# Patient Record
Sex: Male | Born: 1960 | Hispanic: No | Marital: Single | Smoking: Never smoker
Health system: Southern US, Community
[De-identification: ages and names within clinical notes are randomized; demographics above are authoritative.]

## PROBLEM LIST (undated history)

## (undated) DIAGNOSIS — K219 Gastro-esophageal reflux disease without esophagitis: Secondary | ICD-10-CM

## (undated) DIAGNOSIS — E079 Disorder of thyroid, unspecified: Secondary | ICD-10-CM

## (undated) DIAGNOSIS — R739 Hyperglycemia, unspecified: Secondary | ICD-10-CM

## (undated) DIAGNOSIS — F209 Schizophrenia, unspecified: Secondary | ICD-10-CM

## (undated) DIAGNOSIS — E669 Obesity, unspecified: Secondary | ICD-10-CM

## (undated) DIAGNOSIS — E113499 Type 2 diabetes mellitus with severe nonproliferative diabetic retinopathy without macular edema, unspecified eye: Secondary | ICD-10-CM

## (undated) DIAGNOSIS — K580 Irritable bowel syndrome with diarrhea: Secondary | ICD-10-CM

## (undated) DIAGNOSIS — E785 Hyperlipidemia, unspecified: Secondary | ICD-10-CM

## (undated) DIAGNOSIS — R7301 Impaired fasting glucose: Secondary | ICD-10-CM

## (undated) DIAGNOSIS — F419 Anxiety disorder, unspecified: Secondary | ICD-10-CM

## (undated) DIAGNOSIS — F32A Depression, unspecified: Secondary | ICD-10-CM

## (undated) DIAGNOSIS — F71 Moderate intellectual disabilities: Secondary | ICD-10-CM

## (undated) HISTORY — DX: Impaired fasting glucose: R73.01

## (undated) HISTORY — DX: Irritable bowel syndrome with diarrhea: K58.0

## (undated) HISTORY — DX: Moderate intellectual disabilities: F71

## (undated) HISTORY — PX: MOUTH SURGERY: SHX715

## (undated) HISTORY — DX: Type 2 diabetes mellitus with severe nonproliferative diabetic retinopathy without macular edema, unspecified eye: E11.3499

## (undated) HISTORY — DX: Obesity, unspecified: E66.9

## (undated) HISTORY — PX: COLONOSCOPY: SHX174

## (undated) HISTORY — DX: Schizophrenia, unspecified: F20.9

## (undated) HISTORY — DX: Hyperglycemia, unspecified: R73.9

## (undated) HISTORY — DX: Disorder of thyroid, unspecified: E07.9

## (undated) HISTORY — DX: Depression, unspecified: F32.A

## (undated) HISTORY — DX: Hyperlipidemia, unspecified: E78.5

## (undated) HISTORY — DX: Anxiety disorder, unspecified: F41.9

## (undated) HISTORY — DX: Gastro-esophageal reflux disease without esophagitis: K21.9

---

## 1998-04-16 ENCOUNTER — Other Ambulatory Visit: Admission: RE | Admit: 1998-04-16 | Discharge: 1998-04-16 | Payer: Self-pay | Admitting: Family Medicine

## 2014-03-19 ENCOUNTER — Telehealth: Payer: Self-pay | Admitting: Internal Medicine

## 2014-03-19 NOTE — Telephone Encounter (Signed)
S/W PATIENT AND GAVE NEW PATIENT APPT FOR 04/10 @ 1:30 W/DR. CHISM REFERRING DR. Stuart PACKET MAILED.

## 2014-03-20 ENCOUNTER — Telehealth: Payer: Self-pay | Admitting: Internal Medicine

## 2014-03-20 NOTE — Telephone Encounter (Signed)
C/D 03/20/14 for appt. 04/03/14

## 2014-03-23 NOTE — Telephone Encounter (Signed)
S/W PATIENT AND GAVE NEW PATIENT APPT FOR 04/10 @ 1:30 W/DR. CHISM

## 2014-04-03 ENCOUNTER — Other Ambulatory Visit: Payer: Self-pay | Admitting: Internal Medicine

## 2014-04-03 ENCOUNTER — Other Ambulatory Visit (HOSPITAL_BASED_OUTPATIENT_CLINIC_OR_DEPARTMENT_OTHER): Payer: Medicare Other

## 2014-04-03 ENCOUNTER — Telehealth: Payer: Self-pay | Admitting: Internal Medicine

## 2014-04-03 ENCOUNTER — Encounter: Payer: Self-pay | Admitting: Internal Medicine

## 2014-04-03 ENCOUNTER — Ambulatory Visit (HOSPITAL_BASED_OUTPATIENT_CLINIC_OR_DEPARTMENT_OTHER): Payer: Medicare Other | Admitting: Internal Medicine

## 2014-04-03 ENCOUNTER — Ambulatory Visit: Payer: Medicare Other

## 2014-04-03 VITALS — BP 100/60 | HR 72 | Temp 97.7°F | Resp 19 | Wt 126.5 lb

## 2014-04-03 DIAGNOSIS — R634 Abnormal weight loss: Secondary | ICD-10-CM

## 2014-04-03 DIAGNOSIS — D649 Anemia, unspecified: Secondary | ICD-10-CM

## 2014-04-03 DIAGNOSIS — D509 Iron deficiency anemia, unspecified: Secondary | ICD-10-CM

## 2014-04-03 DIAGNOSIS — F79 Unspecified intellectual disabilities: Secondary | ICD-10-CM

## 2014-04-03 DIAGNOSIS — E0590 Thyrotoxicosis, unspecified without thyrotoxic crisis or storm: Secondary | ICD-10-CM

## 2014-04-03 DIAGNOSIS — E785 Hyperlipidemia, unspecified: Secondary | ICD-10-CM

## 2014-04-03 LAB — CBC WITH DIFFERENTIAL/PLATELET
BASO%: 0.3 % (ref 0.0–2.0)
Basophils Absolute: 0 10*3/uL (ref 0.0–0.1)
EOS ABS: 0.1 10*3/uL (ref 0.0–0.5)
EOS%: 0.7 % (ref 0.0–7.0)
HCT: 33 % — ABNORMAL LOW (ref 38.4–49.9)
HGB: 11 g/dL — ABNORMAL LOW (ref 13.0–17.1)
LYMPH#: 1.1 10*3/uL (ref 0.9–3.3)
LYMPH%: 13.8 % — ABNORMAL LOW (ref 14.0–49.0)
MCH: 28.7 pg (ref 27.2–33.4)
MCHC: 33.4 g/dL (ref 32.0–36.0)
MCV: 85.8 fL (ref 79.3–98.0)
MONO#: 1 10*3/uL — ABNORMAL HIGH (ref 0.1–0.9)
MONO%: 11.6 % (ref 0.0–14.0)
NEUT%: 73.6 % (ref 39.0–75.0)
NEUTROS ABS: 6.1 10*3/uL (ref 1.5–6.5)
Platelets: 433 10*3/uL — ABNORMAL HIGH (ref 140–400)
RBC: 3.84 10*6/uL — AB (ref 4.20–5.82)
RDW: 13.5 % (ref 11.0–14.6)
WBC: 8.2 10*3/uL (ref 4.0–10.3)

## 2014-04-03 LAB — COMPREHENSIVE METABOLIC PANEL (CC13)
ALK PHOS: 66 U/L (ref 40–150)
ALT: 9 U/L (ref 0–55)
AST: 13 U/L (ref 5–34)
Albumin: 3 g/dL — ABNORMAL LOW (ref 3.5–5.0)
Anion Gap: 7 mEq/L (ref 3–11)
BUN: 14 mg/dL (ref 7.0–26.0)
CHLORIDE: 106 meq/L (ref 98–109)
CO2: 26 mEq/L (ref 22–29)
Calcium: 9.4 mg/dL (ref 8.4–10.4)
Creatinine: 1 mg/dL (ref 0.7–1.3)
GLUCOSE: 113 mg/dL (ref 70–140)
POTASSIUM: 4 meq/L (ref 3.5–5.1)
SODIUM: 140 meq/L (ref 136–145)
TOTAL PROTEIN: 7.9 g/dL (ref 6.4–8.3)
Total Bilirubin: <0.2 mg/dL (ref 0.20–1.20)

## 2014-04-03 LAB — LACTATE DEHYDROGENASE (CC13): LDH: 154 U/L (ref 125–245)

## 2014-04-03 LAB — IRON AND TIBC CHCC
%SAT: 19 % — AB (ref 20–55)
Iron: 34 ug/dL — ABNORMAL LOW (ref 42–163)
TIBC: 184 ug/dL — ABNORMAL LOW (ref 202–409)
UIBC: 150 ug/dL (ref 117–376)

## 2014-04-03 LAB — FERRITIN CHCC: FERRITIN: 452 ng/mL — AB (ref 22–316)

## 2014-04-03 MED ORDER — FERROUS SULFATE 325 (65 FE) MG PO TBEC: 325.0000 mg | DELAYED_RELEASE_TABLET | Freq: Every day | ORAL | Status: DC

## 2014-04-03 NOTE — Progress Notes (Signed)
Pt here today with his caregivers. He lives in a home called "[facility]".

## 2014-04-03 NOTE — Patient Instructions (Signed)
Iron Deficiency Anemia, Adult Anemia is a condition in which there are less red blood cells or hemoglobin in the blood than normal. Hemoglobin is this part of red blood cells that carries oxygen. Iron deficiency anemia is anemia caused by too little iron. It is the most common type of anemia. It may leave you tired and short of breath. CAUSES   Lack of iron in the diet.  Poor absorption of iron, as seen with intestinal disorders.  Intestinal bleeding.  Heavy periods. SIGNS AND SYMPTOMS  Mild anemia may not be noticeable. Symptoms may include:  Fatigue.  Headache.  Pale skin.  Weakness.  Tiredness.  Shortness of breath.  Dizziness.  Cold hands and feet.  Fast or irregular heartbeat. DIAGNOSIS  Diagnosis requires a thorough evaluation and physical exam by your health care provider. Blood tests are generally used to confirm iron deficiency anemia. Additional tests may be done to find the underlying cause of your anemia. These may include:  Testing for blood in the stool (fecal occult blood test).  A procedure to see inside the colon and rectum (colonoscopy).  A procedure to see inside the esophagus and stomach (endoscopy). TREATMENT  Iron deficiency anemia is treated by correcting the cause of the deficiency. Treatment may involve:  Adding iron-rich foods to your diet.  Taking iron supplements. Pregnant or breastfeeding women need to take extra iron, because their normal diet usually does not provide the required amount.  Taking vitamins. Vitamin C improves the absorption of iron. Your health care provider may recommend taking your iron tablets with a glass of orange juice or vitamin C supplement.  Medicines to make heavy menstrual flow lighter.  Surgery. HOME CARE INSTRUCTIONS   Take iron as directed by your health care provider.  If you cannot tolerate taking iron supplements by mouth, talk to your health care provider about taking them through a vein  (intravenously) or an injection into a muscle.  For the best iron absorption, iron supplements should be taken on an empty stomach. If you cannot tolerate them on an empty stomach, you may need to take them with food.  Do not drink milk or take antacids at the same time as your iron supplements. Milk and antacids may interfere with the absorption of iron.  Iron supplements can cause constipation. Make sure to include fiber in your diet to prevent constipation. A stool softener may also be recommended.  Take vitamins as directed by your health care provider.  Eat a diet rich in iron. Foods high in iron include liver, lean beef, whole-grain bread, eggs, dried fruit, and dark green, leafy vegetables. SEEK IMMEDIATE MEDICAL CARE IF:   You faint. If this happens, do not drive. Call your local emergency services (911 in [place] if no other help is available.  You have chest pain.  You feel nauseous or vomit.  You have severe or increased shortness of breath with activity.  You feel weak.  You have a rapid heartbeat.  You have unexplained sweating.  You become lightheaded when getting up from a chair or bed. MAKE SURE YOU:   Understand these instructions.  Will watch your condition.  Will get help right away if you are not doing well or get worse. Document Released: 12/08/2000 Document Revised: 10/01/2013 Document Reviewed: 08/18/2013 [facility] Patient Information 2014 [place]. Hyperthyroidism The thyroid is a large gland located in the lower front part of your neck. The thyroid helps control metabolism. Metabolism is how your body uses food. It controls  metabolism with the hormone thyroxine. When the thyroid is overactive, it produces too much hormone. When this happens, these following problems may occur:   Nervousness  Heat intolerance  Weight loss (in spite of increase food intake)  Diarrhea  Change in hair or skin texture  Palpitations (heart skipping or having  extra beats)  Tachycardia (rapid heart rate)  Loss of menstruation (amenorrhea)  Shaking of the hands CAUSES  Grave's Disease (the immune system attacks the thyroid gland). This is the most common cause.  Inflammation of the thyroid gland.  Tumor (usually benign) in the thyroid gland or elsewhere.  Excessive use of thyroid medications (both prescription and 'natural').  Excessive ingestion of Iodine. DIAGNOSIS  To prove hyperthyroidism, your caregiver may do blood tests and ultrasound tests. Sometimes the signs are hidden. It may be necessary for your caregiver to watch this illness with blood tests, either before or after diagnosis and treatment. TREATMENT Short-term treatment There are several treatments to control symptoms. Drugs called beta blockers may give some relief. Drugs that decrease hormone production will provide temporary relief in many people. These measures will usually not give permanent relief. Definitive therapy There are treatments available which can be discussed between you and your caregiver which will permanently treat the problem. These treatments range from surgery (removal of the thyroid), to the use of radioactive iodine (destroys the thyroid by radiation), to the use of antithyroid drugs (interfere with hormone synthesis). The first two treatments are permanent and usually successful. They most often require hormone replacement therapy for life. This is because it is impossible to remove or destroy the exact amount of thyroid required to make a person euthyroid (normal). HOME CARE INSTRUCTIONS  See your caregiver if the problems you are being treated for get worse. Examples of this would be the problems listed above. SEEK MEDICAL CARE IF: Your general condition worsens. MAKE SURE YOU:   Understand these instructions.  Will watch your condition.  Will get help right away if you are not doing well or get worse. Document Released: 12/11/2005 Document  Revised: 03/04/2012 Document Reviewed: 04/24/2007 [facility] Patient Information 2014 [place], [place].

## 2014-04-03 NOTE — Progress Notes (Signed)
[place] [phone]   Fax:([phone]  NEW PATIENT EVALUATION   Name: Micheal Vazquez Date: 04/06/2014 MRN: [id number] DOB: 02-15-1961  PCP: Micheal Downing, MD   REFERRING PHYSICIAN: Leonard Vazquez, *  REASON FOR REFERRAL: Anemia NOS and Weight loss    HISTORY OF PRESENT ILLNESS:Micheal Vazquez is a 53 y.o. male who is being referred to our office for further evaluation of anemia NOS and weight loss.   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  They report that he was diagnosed with hyperthyroidism over the past six months and has been compliant to medications for this problem.  He has a weight loss of nearly 15 lbs over the past few months.  He was 180 lbs when he first entered the facility.  He has excellent appetite.  He walks for exercises around the group home, at the park and at the [facility].  Per records accompanied the patient today, he was on methimazole 5 mg bid for what appears to be hyperthyroidism.  TSH was obtained on 03-06-2014 and was within normal limits at 3.160.  His anemia dates back to 11/2013.  His last labs obtained on 03-12-2014 revealed a Hbg of 12.3 and a retic of 0.4.  He has had a screening colonoscopy 12/2012 and it was without evidence of malignancy per the staff (chart notes path with colon tubular adenoma).  Doing this time, he was also H. Pylori positive which was treated.  Patient denies any symptoms of anemia or picca. Labs on 03/18/2014 demonstrate WBC of 9.7; H/H of 12.9/37/7; MCV of 87; Plt count of 175. His labs of 12/29/2013 revealed a free T4 of 0.78 (low) and Ferritin of 353. On 12/24/2013, his TSH was low at 0.327.   PAST MEDICAL HISTORY:  has no past medical history on file.   PAST SURGICAL HISTORY:No past surgical history on file.   CURRENT MEDICATIONS:  has a current medication list which includes the following prescription(s): carbamazepine, epinephrine, escitalopram, lorazepam, pravastatin, risperidone, and ferrous sulfate.   ALLERGIES: Other and Valium   SOCIAL HISTORY:  reports that he has never smoked. He does not have any smokeless tobacco history on file.   FAMILY HISTORY: family history is not on file.  Patient and staff was not sure of the patient's family history.    LABORATORY DATA:  CBC    Component Value Date/Time   WBC 8.2 04/03/2014 1358   RBC 3.84* 04/03/2014 1358   HGB 11.0* 04/03/2014 1358   HCT 33.0* 04/03/2014 1358   PLT 433* 04/03/2014 1358   MCV 85.8 04/03/2014 1358   MCH 28.7 04/03/2014 1358   MCHC 33.4 04/03/2014 1358   RDW 13.5 04/03/2014 1358   LYMPHSABS 1.1 04/03/2014 1358   MONOABS 1.0* 04/03/2014 1358   EOSABS 0.1 04/03/2014 1358   BASOSABS 0.0 04/03/2014 1358   CMP     Component Value Date/Time   NA 140 04/03/2014 1358   K 4.0 04/03/2014 1358   CO2 26 04/03/2014 1358   GLUCOSE 113 04/03/2014 1358   BUN 14.0 04/03/2014 1358   CREATININE 1.0 04/03/2014 1358   CALCIUM 9.4 04/03/2014 1358   PROT 7.9 04/03/2014 1358   ALBUMIN 3.0* 04/03/2014 1358   AST 13 04/03/2014 1358   ALT 9 04/03/2014 1358   ALKPHOS 66 04/03/2014  1358   BILITOT <0.20 04/03/2014 1358    Results for Micheal Vazquez, Micheal Vazquez (MRN [id number]) as of 04/06/2014 04:02  Ref. Range 04/03/2014 13:58  Iron Latest Range: 42-163 ug/dL 34 (L)  UIBC Latest Range: 117-376 ug/dL 150  TIBC Latest Range: 202-409 ug/dL 184 (L)  %SAT Latest Range: 20-55 % 19 (L)  Ferritin Latest Range: 22-316 ng/ml 452 (H)     RADIOGRAPHY: No results found.     REVIEW OF SYSTEMS:  Constitutional: Denies fevers, chills or abnormal weight loss Eyes: Denies blurriness of vision Ears, nose, mouth, throat, and face: Denies mucositis or sore throat Respiratory: Denies cough, dyspnea or wheezes Cardiovascular: Denies palpitation, chest discomfort or lower extremity  swelling Gastrointestinal:  Denies nausea, heartburn or change in bowel habits Skin: Denies abnormal skin rashes Lymphatics: Denies new lymphadenopathy or easy bruising Neurological:Denies numbness, tingling or new weaknesses Behavioral/Psych: Mood is stable, no new changes  All other systems were reviewed with the patient and are negative.  PHYSICAL EXAM:  weight is 126 lb 8 oz (57.38 kg). His oral temperature is 97.7 F (36.5 C). His blood pressure is 100/60 and his pulse is 72. His respiration is 19.    GENERAL:alert, no distress and comfortable; thin, appears stated age.  SKIN: skin color, texture, turgor are normal, no rashes or significant lesions EYES: normal, Conjunctiva are pink and non-injected, sclera clear OROPHARYNX:no exudate, no erythema and lips, buccal mucosa, and tongue normal; poor dentition.  NECK: supple, thyroid normal size, non-tender, without nodularity LYMPH:  no palpable lymphadenopathy in the cervical, axillary or inguinal LUNGS: clear to auscultation and percussion with normal breathing effort HEART: regular rate & rhythm and no murmurs and no lower extremity edema ABDOMEN:abdomen soft, non-tender and normal bowel sounds Musculoskeletal:no cyanosis of digits and no clubbing  NEURO: alert & oriented x 3 with fluent speech, no focal motor/sensory deficits   IMPRESSION: Micheal Vazquez is a 53 y.o. male with a history of    PLAN:  1.  Anemia NOS. --Patient has normocytic anemia with an elevation in his ferritin, low retic from prior labs, and an MCV of 85.8.  He may have anemia of chronic disease secondary to #2 or inflammation.  His kidney function is within normal limits.  He is up-to-date for his screening colonoscopy.  We suggested continuing to treat illnesses and if persistently less than 11 and/or symptoms of anemia persists, he may require a bone marrow biopsy to assess iron stores.  We will check his peripheral blood smear.   2. Hyperthyroidism. --This  might in part explain some of the weight loss.  He is on methimazole with normalization of his TSH.   Other considerations for weight lost would be malignancy.  He denies a smoking history and he has had a screening colonoscopy.  He might require a DRE plus PSA.  He denies bone pain.  His knowledge of his family history is limited.   3. Mental disabled. --He lives at a group home.  They will continue to monitor the above.   4. Follow-up. --He will follow up with me in 2 months for a symptom check visit and cbc and chemistries.   All questions were answered. The patient knows to call the clinic with any problems, questions or concerns. We can certainly see the patient much sooner if necessary.  I spent 40 minutes counseling the patient face to face. The total time spent in the appointment was 60 minutes.    Concha Norway, MD 04/06/2014 4:01 AM

## 2014-04-03 NOTE — Progress Notes (Signed)
Checked in new pt with no financial concerns. °

## 2014-04-03 NOTE — Telephone Encounter (Signed)
Gave pt appt for lab and Md for June 2015 °

## 2014-04-06 DIAGNOSIS — E785 Hyperlipidemia, unspecified: Secondary | ICD-10-CM | POA: Insufficient documentation

## 2014-06-03 ENCOUNTER — Telehealth: Payer: Self-pay | Admitting: Internal Medicine

## 2014-06-03 ENCOUNTER — Other Ambulatory Visit (HOSPITAL_BASED_OUTPATIENT_CLINIC_OR_DEPARTMENT_OTHER): Payer: Medicare Other

## 2014-06-03 ENCOUNTER — Other Ambulatory Visit: Payer: Self-pay | Admitting: *Deleted

## 2014-06-03 ENCOUNTER — Encounter: Payer: Self-pay | Admitting: Internal Medicine

## 2014-06-03 ENCOUNTER — Other Ambulatory Visit: Payer: Self-pay | Admitting: Internal Medicine

## 2014-06-03 ENCOUNTER — Ambulatory Visit (HOSPITAL_BASED_OUTPATIENT_CLINIC_OR_DEPARTMENT_OTHER): Payer: Medicare Other | Admitting: Internal Medicine

## 2014-06-03 VITALS — BP 97/58 | HR 63 | Temp 98.4°F | Resp 19 | Wt 125.2 lb

## 2014-06-03 DIAGNOSIS — D72819 Decreased white blood cell count, unspecified: Secondary | ICD-10-CM

## 2014-06-03 DIAGNOSIS — Z125 Encounter for screening for malignant neoplasm of prostate: Secondary | ICD-10-CM

## 2014-06-03 DIAGNOSIS — D709 Neutropenia, unspecified: Secondary | ICD-10-CM

## 2014-06-03 DIAGNOSIS — D509 Iron deficiency anemia, unspecified: Secondary | ICD-10-CM

## 2014-06-03 DIAGNOSIS — E039 Hypothyroidism, unspecified: Secondary | ICD-10-CM

## 2014-06-03 DIAGNOSIS — D649 Anemia, unspecified: Secondary | ICD-10-CM

## 2014-06-03 DIAGNOSIS — R634 Abnormal weight loss: Secondary | ICD-10-CM

## 2014-06-03 LAB — CBC & DIFF AND RETIC
BASO%: 0.5 % (ref 0.0–2.0)
Basophils Absolute: 0 10*3/uL (ref 0.0–0.1)
EOS%: 0.5 % (ref 0.0–7.0)
Eosinophils Absolute: 0 10*3/uL (ref 0.0–0.5)
HCT: 35.1 % — ABNORMAL LOW (ref 38.4–49.9)
HGB: 12 g/dL — ABNORMAL LOW (ref 13.0–17.1)
Immature Retic Fract: 1.7 % — ABNORMAL LOW (ref 3.00–10.60)
LYMPH%: 32.3 % (ref 14.0–49.0)
MCH: 28.6 pg (ref 27.2–33.4)
MCHC: 34.2 g/dL (ref 32.0–36.0)
MCV: 83.8 fL (ref 79.3–98.0)
MONO#: 0.2 10*3/uL (ref 0.1–0.9)
MONO%: 11.1 % (ref 0.0–14.0)
NEUT#: 1.2 10*3/uL — ABNORMAL LOW (ref 1.5–6.5)
NEUT%: 55.6 % (ref 39.0–75.0)
Platelets: 170 10*3/uL (ref 140–400)
RBC: 4.19 10*6/uL — AB (ref 4.20–5.82)
RDW: 13.6 % (ref 11.0–14.6)
RETIC %: 0.61 % — AB (ref 0.80–1.80)
Retic Ct Abs: 25.56 10*3/uL — ABNORMAL LOW (ref 34.80–93.90)
WBC: 2.2 10*3/uL — AB (ref 4.0–10.3)
lymph#: 0.7 10*3/uL — ABNORMAL LOW (ref 0.9–3.3)

## 2014-06-03 LAB — IRON AND TIBC CHCC
%SAT: 58 % — AB (ref 20–55)
Iron: 123 ug/dL (ref 42–163)
TIBC: 211 ug/dL (ref 202–409)
UIBC: 88 ug/dL — AB (ref 117–376)

## 2014-06-03 LAB — FERRITIN CHCC: Ferritin: 312 ng/ml (ref 22–316)

## 2014-06-03 LAB — CHCC SMEAR

## 2014-06-03 NOTE — Telephone Encounter (Signed)
Gave pt appt for lab and MD  for August 2015 °

## 2014-06-03 NOTE — Patient Instructions (Signed)
Neutropenia Neutropenia is a condition that occurs when the level of a certain type of white blood cell (neutrophil) in your body becomes lower than normal. Neutrophils are made in the bone marrow and fight infections. These cells protect against bacteria and viruses. The fewer neutrophils you have, and the longer your body remains without them, the greater your risk of getting a severe infection becomes. CAUSES  The cause of neutropenia may be hard to determine. However, it is usually due to 3 main problems:   Decreased production of neutrophils. This may be due to:  Certain medicines such as chemotherapy.  Genetic problems.  Cancer.  Radiation treatments.  Vitamin deficiency.  Some pesticides.  Increased destruction of neutrophils. This may be due to:  Overwhelming infections.  Hemolytic anemia. This is when the body destroys its own blood cells.  Chemotherapy.  Neutrophils moving to areas of the body where they cannot fight infections. This may be due to:  Dialysis procedures.  Conditions where the spleen becomes enlarged. Neutrophils are held in the spleen and are not available to the rest of the body.  Overwhelming infections. The neutrophils are held in the area of the infection and are not available to the rest of the body. SYMPTOMS  There are no specific symptoms of neutropenia. The lack of neutrophils can result in an infection, and an infection can cause various problems. DIAGNOSIS  Diagnosis is made by a blood test. A complete blood count is performed. The normal level of neutrophils in human blood differs with age and race. Infants have lower counts than older children and adults. African Americans have lower counts than Caucasians or Asians. The average adult level is 1500 cells/mm3 of blood. Neutrophil counts are interpreted as follows:  Greater than 1000 cells/mm3 gives normal protection against infection.  500 to 1000 cells/mm3 gives an increased risk for  infection.  200 to 500 cells/mm3 is a greater risk for severe infection.  Lower than 200 cells/mm3 is a marked risk of infection. This may require hospitalization and treatment with antibiotic medicines. TREATMENT  Treatment depends on the underlying cause, severity, and presence of infections or symptoms. It also depends on your health. Your caregiver will discuss the treatment plan with you. Mild cases are often easily treated and have a good outcome. Preventative measures may also be started to limit your risk of infections. Treatment can include:  Taking antibiotics.  Stopping medicines that are known to cause neutropenia.  Correcting nutritional deficiencies by eating green vegetables to supply folic acid and taking vitamin B supplements.  Stopping exposure to pesticides if your neutropenia is related to pesticide exposure.  Taking a blood growth factor called sargramostim, pegfilgrastim, or filgrastim if you are undergoing chemotherapy for cancer. This stimulates white blood cell production.  Removal of the spleen if you have Felty's syndrome and have repeated infections. HOME CARE INSTRUCTIONS   Follow your caregiver's instructions about when you need to have blood work done.  Wash your hands often. Make sure others who come in contact with you also wash their hands.  Wash raw fruits and vegetables before eating them. They can carry bacteria and fungi.  Avoid people with colds or spreadable (contagious) diseases (chickenpox, herpes zoster, influenza).  Avoid large crowds.  Avoid construction areas. The dust can release fungus into the air.  Be cautious around children in daycare or school environments.  Take care of your respiratory system by coughing and deep breathing.  Bathe daily.  Protect your skin from cuts and   burns.  Do not work in the garden or with flowers and plants.  Care for the mouth before and after meals by brushing with a soft toothbrush. If you have  mucositis, do not use mouthwash. Mouthwash contains alcohol and can dry out the mouth even more.  Clean the area between the genitals and the anus (perineal area) after urination and bowel movements. Women need to wipe from front to back.  Use a water soluble lubricant during sexual intercourse and practice good hygiene after. Do not have intercourse if you are severely neutropenic. Check with your caregiver for guidelines.  Exercise daily as tolerated.  Avoid people who were vaccinated with a live vaccine in the past 30 days. You should not receive live vaccines (polio, typhoid).  Do not provide direct care for pets. Avoid animal droppings. Do not clean litter boxes and bird cages.  Do not share food utensils.  Do not use tampons, enemas, or rectal suppositories unless directed by your caregiver.  Use an electric razor to remove hair.  Wash your hands after handling magazines, letters, and newspapers. SEEK IMMEDIATE MEDICAL CARE IF:   You have a fever.  You have chills or start to shake.  You feel nauseous or vomit.  You develop mouth sores.  You develop aches and pains.  You have redness and swelling around open wounds.  Your skin is warm to the touch.  You have pus coming from your wounds.  You develop swollen lymph nodes.  You feel weak or fatigued.  You develop red streaks on the skin. MAKE SURE YOU:  Understand these instructions.  Will watch your condition.  Will get help right away if you are not doing well or get worse. Document Released: 06/02/2002 Document Revised: 03/04/2012 Document Reviewed: 06/30/2011 [facility] Patient Information 2014 [place], [place].   Carbamazepine chewable tablets What is this medicine? CARBAMAZEPINE (kar ba MAZ e peen) is used to control seizures caused by certain types of epilepsy. This medicine is also used to treat nerve related pain. It is not for common aches and pains. This medicine may be used for other purposes; ask  your health care provider or pharmacist if you have questions. COMMON BRAND NAME(S): Tegretol What should I tell my health care provider before I take this medicine? They need to know if you have any of these conditions: -Asian ancestry -bone marrow disease -glaucoma -heart disease or irregular heartbeat -kidney disease -liver disease -low blood counts, like low white cell, platelet, or red cell counts -porphyria -psychotic disorders -suicidal thoughts, plans, or attempt; a previous suicide attempt by you or a family member -an unusual or allergic reaction to carbamazepine, tricyclic antidepressants, phenytoin, phenobarbital or other medicines, foods, dyes, or preservatives -pregnant or trying to get pregnant -breast-feeding How should I use this medicine? Take this medicine by mouth. Chew it or swallow whole. Follow the directions on the prescription label. Take this medicine with food. Take your doses at regular intervals. Do not take your medicine more often than directed. Do not stop taking this medicine except on the advice of your doctor or health care professional. A special MedGuide will be given to you by the pharmacist with each prescription and refill. Be sure to read this information carefully each time. Talk to your pediatrician regarding the use of this medicine in children. While this drug may be prescribed for children 65 years of age and younger for selected conditions, precautions do apply. Overdosage: If you think you have taken too much of this  medicine contact a poison control center or emergency room at once. NOTE: This medicine is only for you. Do not share this medicine with others. What if I miss a dose? If you miss a dose, take it as soon as you can. If it is almost time for your next dose, take only that dose. Do not take double or extra doses. What may interact with this medicine? Do not take this medicine with any of the following  medications: -delavirdine -MAOIs like Carbex, Eldepryl, Marplan, Nardil, and Parnate -nefazodone -oxcarbazepine This medicine may also interact with the following medications: -acetaminophen -acetazolamide -barbiturate medicines for inducing sleep or treating seizures, like phenobarbital -certain antibiotics like clarithromycin, erythromycin or troleandomycin -cimetidine -cyclosporine -danazol -dicumarol -doxycycline -male hormones, including estrogens and birth control pills -grapefruit juice -isoniazid, INH -levothyroxine and other thyroid hormones -lithium and other medicines to treat mood problems or psychotic disturbances -loratadine -medicines for angina or high blood pressure -medicines for cancer -medicines for depression or anxiety -medicines for sleep -medicines to treat fungal infections, like fluconazole, itraconazole or ketoconazole -medicines used to treat HIV infection or AIDS -methadone -niacinamide -praziquantel -propoxyphene -rifampin or rifabutin -seizure or epilepsy medicine -steroid medicines such as prednisone or cortisone -theophylline -tramadol -warfarin This list may not describe all possible interactions. Give your health care provider a list of all the medicines, herbs, non-prescription drugs, or dietary supplements you use. Also tell them if you smoke, drink alcohol, or use illegal drugs. Some items may interact with your medicine. What should I watch for while using this medicine? Visit your doctor or health care professional for a regular check on your progress. Do not change brands or dosage forms of this medicine without discussing the change with your doctor or health care professional. If you are taking this medicine for epilepsy (seizures) do not stop taking it suddenly. This increases the risk of seizures. Wear a Probation officer or necklace. Carry an identification card with information about your condition, medications, and doctor or  health care professional. Dennis Bast may get drowsy, dizzy, or have blurred vision. Do not drive, use machinery, or do anything that needs mental alertness until you know how this medicine affects you. To reduce dizzy or fainting spells, do not sit or stand up quickly, especially if you are an older patient. Alcohol can increase drowsiness and dizziness. Avoid alcoholic drinks. Birth control pills may not work properly while you are taking this medicine. Talk to your doctor about using an extra method of birth control. This medicine can make you more sensitive to the sun. Keep out of the sun. If you cannot avoid being in the sun, wear protective clothing and use sunscreen. Do not use sun lamps or tanning beds/booths. The use of this medicine may increase the chance of suicidal thoughts or actions. Pay special attention to how you are responding while on this medicine. Any worsening of mood, or thoughts of suicide or dying should be reported to your health care professional right away. Women who become pregnant while using this medicine may enroll in the [place] Pregnancy Registry by calling [phone]. This registry collects information about the safety of antiepileptic drug use during pregnancy. What side effects may I notice from receiving this medicine? Side effects that you should report to your doctor or health care professional as soon as possible: -allergic reactions like skin rash, itching or hives, swelling of the face, lips, or tongue -breathing problems -change in vision -confusion -dark urine -fast or irregular heartbeat -  fever or chills, sore throat -mouth ulcers -pain or difficulty passing urine -redness, blistering, peeling or loosening of the skin, including inside the mouth -ringing in the ears -seizures -stomach pain -swollen joints or muscle/joint aches and pains -unusual bleeding or bruising -unusually weak or tired -vomiting -worsening of mood,  thoughts or actions of suicide or dying -yellowing of the eyes or skin Side effects that usually do not require medical attention (report to your doctor or health care professional if they continue or are bothersome): -clumsiness or unsteadiness -diarrhea or constipation -headache -increased sweating -nausea This list may not describe all possible side effects. Call your doctor for medical advice about side effects. You may report side effects to FDA at 1-800-FDA-1088. Where should I keep my medicine? Keep out of reach of children. Store at room temperature below 30 degrees C (86 degrees F). Keep container tightly closed. Protect from moisture. Throw away any unused medicine after the expiration date. NOTE: This sheet is a summary. It may not cover all possible information. If you have questions about this medicine, talk to your doctor, pharmacist, or health care provider.  2014, Elsevier/Gold Standard. (2013-02-18 15:29:29)

## 2014-06-03 NOTE — Progress Notes (Signed)
[facility] OFFICE PROGRESS NOTE  Micheal Downing, MD Micheal Vazquez [id number]  DIAGNOSIS: Neutropenia - Plan: CBC with Differential, Comprehensive metabolic panel (Cmet) - [facility], Lactate dehydrogenase (LDH) - [facility]  Neutropenia, unspecified  Anemia, unspecified  Screening PSA (prostate specific antigen) - Plan: Micheal Vazquez Test  Chief Complaint  Patient presents with  . Weight Loss    CURRENT TREATMENT: Ferrous sulfate bid  INTERVAL HISTORY: Micheal Vazquez 53 y.o. male who was referred to our office for further evaluation of anemia NOS and weight loss and seen initially on 03/25/2014.   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 They report that he was diagnosed with hyperthyroidism over the past six months and has been compliant to medications for this problem. He has a weight loss of nearly 15 lbs over the past few months. He was 180 lbs when he first entered the facility. He has excellent appetite. He walks for exercises around the group home, at the park and at the [facility]. Per records accompanied the patient today, he was on methimazole 5 mg bid for what appears to be hyperthyroidism. TSH was obtained on 03-06-2014 and was within normal limits at 3.160. His anemia dates back to 11/2013. His last labs obtained on 03-12-2014 revealed a Hbg of 12.3 and a retic of 0.4. He has had a screening colonoscopy 12/2012 and it was without evidence of malignancy per the staff (chart notes path with colon tubular adenoma). Doing this time, he was also H. Pylori positive which was treated. Patient denies any symptoms of anemia or picca. Labs on 03/18/2014 demonstrate WBC of 9.7; H/H of 12.9/37/7; MCV of 87; Plt count of 175. His labs of 12/29/2013 revealed a free T4 of 0.78 (low) and Ferritin of 353. On  12/24/2013, his TSH was low at 0.327.   Today, he is accompanied by [employer] conservation officer) at the facility, [facility].   He reports patient's weight has stabilized.   Patient denies any acute medical illnesses.     MEDICAL HISTORY:No past medical history on file.  INTERIM HISTORY: has Hyperlipidemia; Neutropenia, unspecified; Anemia, unspecified; and Screening PSA (prostate specific antigen) on his problem list.    ALLERGIES:  is allergic to other and valium.  MEDICATIONS: has a current medication list which includes the following prescription(s): carbamazepine, epinephrine, escitalopram, ferrous sulfate, lorazepam, pravastatin, and risperidone.  SURGICAL HISTORY: No past surgical history on file.  REVIEW OF SYSTEMS:   Constitutional: Denies fevers, chills or as noted in HPI Eyes: Denies blurriness of vision Ears, nose, mouth, throat, and face: Denies mucositis or sore throat Respiratory: Denies cough, dyspnea or wheezes Cardiovascular: Denies palpitation, chest discomfort or lower extremity swelling Gastrointestinal:  Denies nausea, heartburn or change in bowel habits Skin: Denies abnormal skin rashes Lymphatics: Denies new lymphadenopathy or easy bruising Neurological:Denies numbness, tingling or new weaknesses Behavioral/Psych: Mood is stable, no new changes  All other systems were reviewed with the patient and are negative.  PHYSICAL EXAMINATION: ECOG PERFORMANCE STATUS: 0 - Asymptomatic  Blood pressure 97/58, pulse 63, temperature 98.4 F (36.9 C), temperature source Oral, resp. rate 19, weight 125 lb 3.2 oz (56.79 kg), SpO2 100.00%.  GENERAL:alert, no distress and comfortable SKIN: skin color, texture, turgor are normal, no rashes or significant lesions EYES: normal, Conjunctiva are  pink and non-injected, sclera clear OROPHARYNX:no exudate, no erythema and lips, buccal mucosa, and tongue normal; poor dentition.  NECK: supple, thyroid normal size,  non-tender, without nodularity LYMPH:  no palpable lymphadenopathy in the cervical, axillary or supraclavicular LUNGS: clear to auscultation with normal breathing effort, no wheezes or rhonchi HEART: regular rate & rhythm and no murmurs and no lower extremity edema ABDOMEN:abdomen soft, non-tender and normal bowel sounds Musculoskeletal:no cyanosis of digits and no clubbing  NEURO: alert & oriented x 3 with fluent speech, no focal motor/sensory deficits  Labs:  Lab Results  Component Value Date   WBC 2.2* 06/03/2014   HGB 12.0* 06/03/2014   HCT 35.1* 06/03/2014   MCV 83.8 06/03/2014   PLT 170 06/03/2014   NEUTROABS 1.2* 06/03/2014      Chemistry      Component Value Date/Time   NA 140 04/03/2014 1358   K 4.0 04/03/2014 1358   CO2 26 04/03/2014 1358   BUN 14.0 04/03/2014 1358   CREATININE 1.0 04/03/2014 1358      Component Value Date/Time   CALCIUM 9.4 04/03/2014 1358   ALKPHOS 66 04/03/2014 1358   AST 13 04/03/2014 1358   ALT 9 04/03/2014 1358   BILITOT <0.20 04/03/2014 1358       Basic Metabolic Panel: No results found for this basename: NA, K, CL, CO2, GLUCOSE, BUN, CREATININE, CALCIUM, MG, PHOS,  in the last 168 hours GFR CrCl is unknown because there is no height on file for the current visit. Liver Function Tests: No results found for this basename: AST, ALT, ALKPHOS, BILITOT, PROT, ALBUMIN,  in the last 168 hours No results found for this basename: LIPASE, AMYLASE,  in the last 168 hours No results found for this basename: AMMONIA,  in the last 168 hours Coagulation profile No results found for this basename: INR, PROTIME,  in the last 168 hours  CBC:  Recent Labs Lab 06/03/14 0900  WBC 2.2*  NEUTROABS 1.2*  HGB 12.0*  HCT 35.1*  MCV 83.8  PLT 170    Anemia work up  Recent Labs  06/03/14 0900  FERRITIN 312  TIBC 211  IRON 123    Studies:  No results found.   RADIOGRAPHIC STUDIES: No results found.  ASSESSMENT: Micheal Vazquez 53 y.o. male with a history  of Neutropenia - Plan: CBC with Differential, Comprehensive metabolic panel (Cmet) - [facility], Lactate dehydrogenase (LDH) - [facility]  Neutropenia, unspecified  Anemia, unspecified  Screening PSA (prostate specific antigen) - Plan: Micheal Vazquez Test  PLAN:   1. Anemia NOS.  --Patient has had normocytic anemia with an elevation in his ferritin, low retic from prior labs, and an MCV of 85.8. Today his hemoglobin in 12.   His iron indices are as noted above.  He likely has anemia of chronic disease secondary to #2 or inflammation. His kidney function is within normal limits. He is up-to-date for his screening colonoscopy. We suggested continuing to treat illnesses and if persistently less than 11 and/or symptoms of anemia persists, he may require a bone marrow biopsy to assess iron stores.   2. Leukopenia with mild neutropenia. --He is on tegretol which can cause bone marrow suppression. He will need these levels followed.  We cancelled extensively to report any infections or fevers.  He denies any recurrent infections or fevers presently.   3. Hyperthyroidism and weight loss  --This might in part explain some of the weight loss. He is on methimazole with normalization of his TSH. Other considerations  for weight lost would be malignancy. He denies a smoking history and he has had a screening colonoscopy. He might require a DRE plus PSA. He denies bone pain. His knowledge of his family history is limited.   4. Mental disabled.  --He lives at a group home. They will continue to monitor the above.   5. Follow-up.  --He will follow up with me in 2 months for a symptom check visit and cbc and chemistries. He will have labs including a CBC at his facility and results faxed to our office.   All questions were answered. The patient knows to call the clinic with any problems, questions or concerns. We can certainly see the patient much sooner if necessary.  I spent 15 minutes counseling the patient face to  face. The total time spent in the appointment was 25 minutes.    Micheal Axon, MD 06/03/2014 2:33 PM

## 2014-06-04 LAB — PSA, MEDICARE: PSA: 0.98 ng/mL (ref <=4.00)

## 2014-07-13 ENCOUNTER — Other Ambulatory Visit: Payer: Self-pay | Admitting: Medical Oncology

## 2014-07-14 ENCOUNTER — Other Ambulatory Visit: Payer: Self-pay | Admitting: Internal Medicine

## 2014-07-14 ENCOUNTER — Other Ambulatory Visit: Payer: Self-pay

## 2014-07-14 DIAGNOSIS — D649 Anemia, unspecified: Secondary | ICD-10-CM

## 2014-07-14 MED ORDER — FERROUS SULFATE 325 (65 FE) MG PO TBEC: 325.0000 mg | DELAYED_RELEASE_TABLET | Freq: Every day | ORAL | Status: DC

## 2014-07-14 NOTE — Telephone Encounter (Signed)
Conferred w/Dr Chism that 1 tab daily was correct.

## 2014-07-23 ENCOUNTER — Telehealth: Payer: Self-pay | Admitting: Internal Medicine

## 2014-07-23 NOTE — Telephone Encounter (Signed)
, °

## 2014-07-29 ENCOUNTER — Ambulatory Visit: Payer: Medicare Other

## 2014-07-29 ENCOUNTER — Other Ambulatory Visit: Payer: Medicare Other

## 2014-08-12 ENCOUNTER — Other Ambulatory Visit (HOSPITAL_BASED_OUTPATIENT_CLINIC_OR_DEPARTMENT_OTHER): Payer: Medicare Other

## 2014-08-12 ENCOUNTER — Telehealth: Payer: Self-pay | Admitting: Internal Medicine

## 2014-08-12 ENCOUNTER — Ambulatory Visit (HOSPITAL_BASED_OUTPATIENT_CLINIC_OR_DEPARTMENT_OTHER): Payer: Medicare Other | Admitting: Internal Medicine

## 2014-08-12 VITALS — BP 123/69 | HR 62 | Temp 98.1°F | Resp 18 | Wt 130.0 lb

## 2014-08-12 DIAGNOSIS — D649 Anemia, unspecified: Secondary | ICD-10-CM

## 2014-08-12 DIAGNOSIS — Z125 Encounter for screening for malignant neoplasm of prostate: Secondary | ICD-10-CM

## 2014-08-12 DIAGNOSIS — D709 Neutropenia, unspecified: Secondary | ICD-10-CM

## 2014-08-12 DIAGNOSIS — R634 Abnormal weight loss: Secondary | ICD-10-CM

## 2014-08-12 DIAGNOSIS — E0590 Thyrotoxicosis, unspecified without thyrotoxic crisis or storm: Secondary | ICD-10-CM

## 2014-08-12 LAB — CBC WITH DIFFERENTIAL/PLATELET
BASO%: 0.4 % (ref 0.0–2.0)
BASOS ABS: 0 10*3/uL (ref 0.0–0.1)
EOS ABS: 0 10*3/uL (ref 0.0–0.5)
EOS%: 0.7 % (ref 0.0–7.0)
HEMATOCRIT: 36.8 % — AB (ref 38.4–49.9)
HEMOGLOBIN: 12.2 g/dL — AB (ref 13.0–17.1)
LYMPH#: 1.1 10*3/uL (ref 0.9–3.3)
LYMPH%: 24.8 % (ref 14.0–49.0)
MCH: 28.7 pg (ref 27.2–33.4)
MCHC: 33.2 g/dL (ref 32.0–36.0)
MCV: 86.4 fL (ref 79.3–98.0)
MONO#: 0.5 10*3/uL (ref 0.1–0.9)
MONO%: 11.5 % (ref 0.0–14.0)
NEUT#: 2.8 10*3/uL (ref 1.5–6.5)
NEUT%: 62.6 % (ref 39.0–75.0)
PLATELETS: 203 10*3/uL (ref 140–400)
RBC: 4.26 10*6/uL (ref 4.20–5.82)
RDW: 13.7 % (ref 11.0–14.6)
WBC: 4.5 10*3/uL (ref 4.0–10.3)

## 2014-08-12 LAB — COMPREHENSIVE METABOLIC PANEL (CC13)
ALT: 11 U/L (ref 0–55)
AST: 18 U/L (ref 5–34)
Albumin: 3.8 g/dL (ref 3.5–5.0)
Alkaline Phosphatase: 77 U/L (ref 40–150)
Anion Gap: 6 mEq/L (ref 3–11)
BUN: 10.7 mg/dL (ref 7.0–26.0)
CALCIUM: 9.2 mg/dL (ref 8.4–10.4)
CHLORIDE: 107 meq/L (ref 98–109)
CO2: 26 mEq/L (ref 22–29)
Creatinine: 0.9 mg/dL (ref 0.7–1.3)
Glucose: 93 mg/dl (ref 70–140)
Potassium: 3.9 mEq/L (ref 3.5–5.1)
Sodium: 139 mEq/L (ref 136–145)
Total Bilirubin: <0.2 mg/dL (ref 0.20–1.20)
Total Protein: 7.1 g/dL (ref 6.4–8.3)

## 2014-08-12 LAB — LACTATE DEHYDROGENASE (CC13): LDH: 184 U/L (ref 125–245)

## 2014-08-12 NOTE — Progress Notes (Signed)
[place] OFFICE PROGRESS NOTE  Micheal Downing, MD [address] [place] [address]  DIAGNOSIS: Neutropenia, unspecified  Chief Complaint  Patient presents with  . Follow-up    CURRENT TREATMENT: Ferrous sulfate bid  INTERVAL HISTORY: Micheal Vazquez 53 y.o. male who was referred to our office for further evaluation of anemia NOS and weight loss and seen initially on 03/25/2014.  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 They report that he was diagnosed with hyperthyroidism over the past six months and has been compliant to medications for this problem. He has a weight loss of nearly 15 lbs over the past few months. He was 180 lbs when he first entered the facility. He has excellent appetite. He walks for exercises around the group home, at the park and at the [facility]. Per records accompanied the patient today, he was on methimazole 5 mg bid for what appears to be hyperthyroidism. TSH was obtained on 03-06-2014 and was within normal limits at 3.160. His anemia dates back to 11/2013. His last labs obtained on 03-12-2014 revealed a Hbg of 12.3 and a retic of 0.4. He has had a screening colonoscopy 12/2012 and it was without evidence of malignancy per the staff (chart notes path with colon tubular adenoma). Doing this time, he was also H. Pylori positive which was treated. Patient denies any symptoms of anemia or picca. Labs on 03/18/2014 demonstrate WBC of 9.7; H/H of 12.9/37/7; MCV of 87; Plt count of 175. His labs of 12/29/2013 revealed a free T4 of 0.78 (low) and Ferritin of 353. On 12/24/2013, his TSH was low at 0.327.   Today, he is accompanied by Micheal Vazquez) at the facility, [facility].   He reports patient's weight has increased by 5 lbs.   Patient denies any acute medical  illnesses.    MEDICAL HISTORY:No past medical history on file.  INTERIM HISTORY: has Hyperlipidemia; Neutropenia, unspecified; Anemia, unspecified; and Screening PSA (prostate specific antigen) on his problem list.    ALLERGIES:  is allergic to other and valium.  MEDICATIONS: has a current medication list which includes the following prescription(s): carbamazepine, epinephrine, escitalopram, ferrous sulfate, lorazepam, pravastatin, and risperidone.  SURGICAL HISTORY: No past surgical history on file.  REVIEW OF SYSTEMS:   Constitutional: Denies fevers, chills or as noted in HPI Eyes: Denies blurriness of vision Ears, nose, mouth, throat, and face: Denies mucositis or sore throat Respiratory: Denies cough, dyspnea or wheezes Cardiovascular: Denies palpitation, chest discomfort or lower extremity swelling Gastrointestinal:  Denies nausea, heartburn or change in bowel habits Skin: Denies abnormal skin rashes Lymphatics: Denies new lymphadenopathy or easy bruising Neurological:Denies numbness, tingling or new weaknesses Behavioral/Psych: Mood is stable, no new changes  All other systems were reviewed with the patient and are negative.  PHYSICAL EXAMINATION: ECOG PERFORMANCE STATUS: 0 - Asymptomatic  Blood pressure 123/69, pulse 62, temperature 98.1 F (36.7 C), temperature source Oral, resp. rate 18, weight 130 lb (58.968 kg), SpO2 100.00%.  GENERAL:alert, no distress and comfortable SKIN: skin color, texture, turgor are normal, no rashes or significant lesions EYES: normal, Conjunctiva are pink and non-injected, sclera clear OROPHARYNX:no exudate, no erythema and lips, buccal mucosa, and tongue normal; poor dentition.  NECK: supple, thyroid normal size, non-tender, without nodularity LYMPH:  no palpable lymphadenopathy in the  cervical, axillary or supraclavicular LUNGS: clear to auscultation with normal breathing effort, no wheezes or rhonchi HEART: regular rate & rhythm and no  murmurs and no lower extremity edema ABDOMEN:abdomen soft, non-tender and normal bowel sounds Musculoskeletal:no cyanosis of digits and no clubbing  NEURO: alert & oriented x 3 with fluent speech, no focal motor/sensory deficits  Labs:  Lab Results  Component Value Date   WBC 4.5 08/12/2014   HGB 12.2* 08/12/2014   HCT 36.8* 08/12/2014   MCV 86.4 08/12/2014   PLT 203 08/12/2014   NEUTROABS 2.8 08/12/2014      Chemistry      Component Value Date/Time   NA 139 08/12/2014 1018   K 3.9 08/12/2014 1018   CO2 26 08/12/2014 1018   BUN 10.7 08/12/2014 1018   CREATININE 0.9 08/12/2014 1018      Component Value Date/Time   CALCIUM 9.2 08/12/2014 1018   ALKPHOS 77 08/12/2014 1018   AST 18 08/12/2014 1018   ALT 11 08/12/2014 1018   BILITOT <0.20 08/12/2014 1018     Results for Micheal, Vazquez (MRN [id number]) as of 08/12/2014 11:26  Ref. Range 06/03/2014 14:24  PSA Latest Range: <=4.00 ng/mL 3.21    Basic Metabolic Panel:  Recent Labs Lab 08/12/14 1018  NA 139  K 3.9  CO2 26  GLUCOSE 93  BUN 10.7  CREATININE 0.9  CALCIUM 9.2   GFR CrCl is unknown because there is no height on file for the current visit. Liver Function Tests:  Recent Labs Lab 08/12/14 1018  AST 18  ALT 11  ALKPHOS 77  BILITOT <0.20  PROT 7.1  ALBUMIN 3.8    CBC:  Recent Labs Lab 08/12/14 1015  WBC 4.5  NEUTROABS 2.8  HGB 12.2*  HCT 36.8*  MCV 86.4  PLT 203    Anemia work up No results found for this basename: VITAMINB12, FOLATE, FERRITIN, TIBC, IRON, RETICCTPCT,  in the last 72 hours  Studies:  No results found.   RADIOGRAPHIC STUDIES: No results found.  ASSESSMENT: Micheal Vazquez 53 y.o. male with a history of Neutropenia, unspecified  PLAN:   1. Anemia NOS,stable.  --Patient has had normocytic anemia with an elevation in his ferritin, low retic from prior labs, and an MCV of 86.4. Today his hemoglobin in 12.2 which is stable.   He likely has anemia of chronic disease secondary to #2 or  inflammation. His kidney function is within normal limits. He is up-to-date for his screening colonoscopy. We suggested continuing to treat illnesses and if persistently less than 11 and/or symptoms of anemia persists, he may require a bone marrow biopsy to assess iron stores. He was instructed to stop ferrous sulfate.  2. Leukopenia with mild neutropenia, resolved. --He is on tegretol which can cause bone marrow suppression. These levels are being followed.    He denies any recurrent infections or fevers presently.   3. Hyperthyroidism and weight loss, improved.  --This might in part explain some of the weight loss. He is on methimazole with normalization of his TSH. Other considerations for weight lost would be malignancy. He denies a smoking history and he has had a screening colonoscopy. PSA, within normal limits.  He denies bone pain. His knowledge of his family history is limited.  He gained 5 lbs and has good appetite.   4. Mental disabled.  --He lives at a group home. They will continue to monitor the above.   5. Follow-up.  --He will follow up with me in 6  months for a symptom check visit and cbc and chemistries.   All questions were answered. The patient knows to call the clinic with any problems, questions or concerns. We can certainly see the patient much sooner if necessary.  I spent 15 minutes counseling the patient face to face. The total time spent in the appointment was 25 minutes.    Marivel Mcclarty, MD 08/12/2014 11:26 AM

## 2014-08-12 NOTE — Telephone Encounter (Signed)
Pt confirmed labs/ov per 08/19 POF, gave pt AVS....KJ °

## 2014-08-13 LAB — PSA, MEDICARE: PSA: 1.3 ng/mL (ref <=4.00)

## 2015-01-19 ENCOUNTER — Telehealth: Payer: Self-pay | Admitting: Hematology

## 2015-01-19 NOTE — Telephone Encounter (Signed)
, °

## 2015-02-09 ENCOUNTER — Ambulatory Visit: Payer: Medicare Other

## 2015-02-09 ENCOUNTER — Telehealth: Payer: Self-pay | Admitting: Hematology

## 2015-02-09 ENCOUNTER — Other Ambulatory Visit: Payer: Medicare Other

## 2015-02-09 NOTE — Telephone Encounter (Signed)
s.w. pt and confirmed appt....pt ok and aware °

## 2015-02-12 ENCOUNTER — Encounter: Payer: Self-pay | Admitting: Hematology

## 2015-02-12 ENCOUNTER — Ambulatory Visit (HOSPITAL_BASED_OUTPATIENT_CLINIC_OR_DEPARTMENT_OTHER): Payer: Medicare Other | Admitting: Hematology

## 2015-02-12 ENCOUNTER — Telehealth: Payer: Self-pay | Admitting: Hematology

## 2015-02-12 ENCOUNTER — Other Ambulatory Visit: Payer: Medicare Other

## 2015-02-12 VITALS — BP 110/61 | HR 52 | Temp 97.8°F | Resp 18 | Ht 68.0 in | Wt 144.5 lb

## 2015-02-12 DIAGNOSIS — D649 Anemia, unspecified: Secondary | ICD-10-CM

## 2015-02-12 DIAGNOSIS — D72819 Decreased white blood cell count, unspecified: Secondary | ICD-10-CM

## 2015-02-12 DIAGNOSIS — R634 Abnormal weight loss: Secondary | ICD-10-CM

## 2015-02-12 DIAGNOSIS — E0590 Thyrotoxicosis, unspecified without thyrotoxic crisis or storm: Secondary | ICD-10-CM

## 2015-02-12 NOTE — Telephone Encounter (Signed)
Gave avs & calendar for February 2017. °

## 2015-02-12 NOTE — Progress Notes (Signed)
[place] OFFICE PROGRESS NOTE  [name], MD [name] [address]  DIAGNOSIS: Anemia, unspecified anemia type   CURRENT TREATMENT: observation   INITIAL PRESENTATION HISTORY: Micheal Vazquez 54 y.o. male who was referred to our office for further evaluation of anemia NOS and weight loss and seen initially on 03/25/2014.  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 They report that he was diagnosed with hyperthyroidism over the past six months and has been compliant to medications for this problem. He has a weight loss of nearly 15 lbs over the past few months. He was 180 lbs when he first entered the facility. He has excellent appetite. He walks for exercises around the group home, at the park and at the [facility]. Per records accompanied the patient today, he was on methimazole 5 mg bid for what appears to be hyperthyroidism. TSH was obtained on 03-06-2014 and was within normal limits at 3.160. His anemia dates back to 11/2013. His last labs obtained on 03-12-2014 revealed a Hbg of 12.3 and a retic of 0.4. He has had a screening colonoscopy 12/2012 and it was without evidence of malignancy per the staff (chart notes path with colon tubular adenoma). Doing this time, he was also H. Pylori positive which was treated. Patient denies any symptoms of anemia or picca. Labs on 03/18/2014 demonstrate WBC of 9.7; H/H of 12.9/37/7; MCV of 87; Plt count of 175. His labs of 12/29/2013 revealed a free T4 of 0.78 (low) and Ferritin of 353. On 12/24/2013, his TSH was low at 0.327.   INTERIM HISTORY: Today, he is accompanied by Elta Guadeloupe Nature conservation officer) at the facility, [facility]. He is doing well overall, came some more weight lately. He denies any pain, dyspnea, or abdominal discomfort. He remains to  be physically active as a group home.  MEDICAL HISTORY: 1. Severe mental disability 2 hyperlipidemia 3 seizure disorder.    ALLERGIES:  is allergic to other and valium.  MEDICATIONS:    Medication List       This list is accurate as of: 02/12/15 11:41 AM.  Always use your most recent med list.               carbamazepine 200 MG 12 hr tablet  Commonly known as:  TEGRETOL XR  Take 200 mg by mouth 2 (two) times daily. Take one tab in am, at noon and 2 tabs at bedtime (8am, noon and 8pm)     EPIPEN IJ  Inject 0.3 mg as directed as needed.     escitalopram 10 MG tablet  Commonly known as:  LEXAPRO  Take 10 mg by mouth daily.     LORazepam 1 MG tablet  Commonly known as:  ATIVAN  Take 1 mg by mouth 2 (two) times daily.     methimazole 5 MG tablet  Commonly known as:  TAPAZOLE  Take 5 mg by mouth 3 (three) times daily. 8am, 2pm and 8pm     pravastatin 40 MG tablet  Commonly known as:  PRAVACHOL  Take 40 mg by mouth daily.     risperiDONE 3 MG tablet  Commonly known as:  RISPERDAL  Take 3 mg by mouth 2 (two) times daily.        SURGICAL HISTORY: No past surgical  history on file.  REVIEW OF SYSTEMS:   Constitutional: Denies fevers, chills or as noted in HPI Eyes: Denies blurriness of vision Ears, nose, mouth, throat, and face: Denies mucositis or sore throat Respiratory: Denies cough, dyspnea or wheezes Cardiovascular: Denies palpitation, chest discomfort or lower extremity swelling Gastrointestinal:  Denies nausea, heartburn or change in bowel habits Skin: Denies abnormal skin rashes Lymphatics: Denies new lymphadenopathy or easy bruising Neurological:Denies numbness, tingling or new weaknesses Behavioral/Psych: Mood is stable, no new changes  All other systems were reviewed with the patient and are negative.  PHYSICAL EXAMINATION: ECOG PERFORMANCE STATUS: 0 - Asymptomatic  Blood pressure 110/61, pulse 52, temperature 97.8 F (36.6 C), temperature source  Oral, resp. rate 18, height '5\' 8"'  (1.727 m), weight 144 lb 8 oz (65.545 kg), SpO2 100 %.  GENERAL:alert, no distress and comfortable SKIN: skin color, texture, turgor are normal, no rashes or significant lesions EYES: normal, Conjunctiva are pink and non-injected, sclera clear OROPHARYNX:no exudate, no erythema and lips, buccal mucosa, and tongue normal; poor dentition.  NECK: supple, thyroid normal size, non-tender, without nodularity LYMPH:  no palpable lymphadenopathy in the cervical, axillary or supraclavicular LUNGS: clear to auscultation with normal breathing effort, no wheezes or rhonchi HEART: regular rate & rhythm and no murmurs and no lower extremity edema ABDOMEN:abdomen soft, non-tender and normal bowel sounds Musculoskeletal:no cyanosis of digits and no clubbing  NEURO: alert & oriented x 3 with fluent speech, no focal motor/sensory deficits  Labs:  His outside CBC on 11/11/2014 showed W BC 3.3, hemoglobin 13.1, hematocrit 38.4, MCV 87,.   RADIOGRAPHIC STUDIES: No results found.  ASSESSMENT: Micheal Vazquez 54 y.o. male with a history of Anemia, unspecified anemia type  PLAN:   1. Anemia NOS, improved.  --Patient has had normocytic anemia with an elevation in his ferritin, low retic from prior labs, and an MCV of 86.4. Today his hemoglobin in 12.2 which is stable.   He likely has anemia of chronic disease or secondary to his psych medication. His kidney function is within normal limits. He is up-to-date for his screening colonoscopy.  - His anemia was resolved on lab work 3 months ago. -I do not think he needs bone marrow biopsy.  2. Leukopenia with mild neutropenia, resolved. -likely secondary to his psych medication.    He denies any recurrent infections or fevers presently.   3. Hyperthyroidism and weight loss, improved.  -- improved   4. Mental disabled.  --He lives at a group home. They will continue to monitor the above.   5. Follow-up.  --He will follow up with me  in 12 months for a symptom check visit and cbc. If his CBC remains stable, he will follow up with his primary care physician afterwards.   All questions were answered. The patient knows to call the clinic with any problems, questions or concerns. We can certainly see the patient much sooner if necessary.  I spent 15 minutes counseling the patient face to face. The total time spent in the appointment was 25 minutes.    Truitt Merle, MD 02/12/2015 11:40 AM

## 2016-01-20 ENCOUNTER — Telehealth: Payer: Self-pay | Admitting: Hematology

## 2016-01-20 NOTE — Telephone Encounter (Signed)
tried to call pt to adv appt time was r/s got fax machine ring-mailed pt copy of updated copy of avs

## 2016-02-09 ENCOUNTER — Other Ambulatory Visit: Payer: Self-pay | Admitting: *Deleted

## 2016-02-09 DIAGNOSIS — D509 Iron deficiency anemia, unspecified: Secondary | ICD-10-CM

## 2016-02-10 ENCOUNTER — Other Ambulatory Visit: Payer: Medicare Other

## 2016-02-10 ENCOUNTER — Encounter: Payer: Self-pay | Admitting: Hematology

## 2016-02-10 ENCOUNTER — Ambulatory Visit: Payer: Medicare Other | Admitting: Hematology

## 2016-02-10 ENCOUNTER — Encounter: Payer: Medicare Other | Admitting: Hematology

## 2016-02-10 NOTE — Progress Notes (Signed)
No show  This encounter was created in error - please disregard.

## 2016-02-11 ENCOUNTER — Telehealth: Payer: Self-pay | Admitting: Hematology

## 2016-02-14 ENCOUNTER — Telehealth: Payer: Self-pay | Admitting: Hematology

## 2016-02-14 NOTE — Telephone Encounter (Signed)
Per 2/16 pof patient missed appointment today - reschedule only if patient wants - do not reschedule if patient does not agree. Not able to reach patient - message to YF re dictating a letter to patient about whether or not he would like to continue to be seen in office.

## 2016-02-21 ENCOUNTER — Encounter: Payer: Self-pay | Admitting: Hematology

## 2017-01-16 DIAGNOSIS — F72 Severe intellectual disabilities: Secondary | ICD-10-CM | POA: Diagnosis not present

## 2017-01-16 DIAGNOSIS — F209 Schizophrenia, unspecified: Secondary | ICD-10-CM | POA: Diagnosis not present

## 2017-01-17 DIAGNOSIS — F2089 Other schizophrenia: Secondary | ICD-10-CM | POA: Diagnosis not present

## 2017-01-17 DIAGNOSIS — G35 Multiple sclerosis: Secondary | ICD-10-CM | POA: Diagnosis not present

## 2017-03-02 DIAGNOSIS — F259 Schizoaffective disorder, unspecified: Secondary | ICD-10-CM | POA: Diagnosis not present

## 2017-03-02 DIAGNOSIS — K219 Gastro-esophageal reflux disease without esophagitis: Secondary | ICD-10-CM | POA: Diagnosis not present

## 2017-03-02 DIAGNOSIS — Z79899 Other long term (current) drug therapy: Secondary | ICD-10-CM | POA: Diagnosis not present

## 2017-03-21 DIAGNOSIS — F72 Severe intellectual disabilities: Secondary | ICD-10-CM | POA: Diagnosis not present

## 2017-03-21 DIAGNOSIS — F209 Schizophrenia, unspecified: Secondary | ICD-10-CM | POA: Diagnosis not present

## 2017-04-10 DIAGNOSIS — F79 Unspecified intellectual disabilities: Secondary | ICD-10-CM | POA: Diagnosis not present

## 2017-05-14 DIAGNOSIS — E785 Hyperlipidemia, unspecified: Secondary | ICD-10-CM | POA: Diagnosis not present

## 2017-05-14 DIAGNOSIS — E039 Hypothyroidism, unspecified: Secondary | ICD-10-CM | POA: Diagnosis not present

## 2017-05-14 DIAGNOSIS — Z01810 Encounter for preprocedural cardiovascular examination: Secondary | ICD-10-CM | POA: Diagnosis not present

## 2017-05-14 DIAGNOSIS — Z01419 Encounter for gynecological examination (general) (routine) without abnormal findings: Secondary | ICD-10-CM | POA: Diagnosis not present

## 2017-05-14 DIAGNOSIS — R740 Nonspecific elevation of levels of transaminase and lactic acid dehydrogenase [LDH]: Secondary | ICD-10-CM | POA: Diagnosis not present

## 2017-05-14 DIAGNOSIS — Z01818 Encounter for other preprocedural examination: Secondary | ICD-10-CM | POA: Diagnosis not present

## 2017-05-14 DIAGNOSIS — F209 Schizophrenia, unspecified: Secondary | ICD-10-CM | POA: Diagnosis not present

## 2017-05-14 DIAGNOSIS — R7309 Other abnormal glucose: Secondary | ICD-10-CM | POA: Diagnosis not present

## 2017-05-28 ENCOUNTER — Other Ambulatory Visit: Payer: Self-pay | Admitting: Family Medicine

## 2017-05-28 DIAGNOSIS — R7989 Other specified abnormal findings of blood chemistry: Secondary | ICD-10-CM

## 2017-05-28 DIAGNOSIS — R945 Abnormal results of liver function studies: Principal | ICD-10-CM

## 2017-05-30 DIAGNOSIS — F72 Severe intellectual disabilities: Secondary | ICD-10-CM | POA: Diagnosis not present

## 2017-05-30 DIAGNOSIS — F209 Schizophrenia, unspecified: Secondary | ICD-10-CM | POA: Diagnosis not present

## 2017-06-04 ENCOUNTER — Other Ambulatory Visit: Payer: Medicare Other

## 2017-06-11 ENCOUNTER — Ambulatory Visit
Admission: RE | Admit: 2017-06-11 | Discharge: 2017-06-11 | Disposition: A | Discharge status: Home / Self Care | Payer: Medicare Other | Source: Ambulatory Visit | Attending: Family Medicine | Admitting: Family Medicine

## 2017-06-11 DIAGNOSIS — R7989 Other specified abnormal findings of blood chemistry: Secondary | ICD-10-CM

## 2017-06-11 DIAGNOSIS — R945 Abnormal results of liver function studies: Principal | ICD-10-CM

## 2017-06-14 DIAGNOSIS — R740 Nonspecific elevation of levels of transaminase and lactic acid dehydrogenase [LDH]: Secondary | ICD-10-CM | POA: Diagnosis not present

## 2017-06-15 DIAGNOSIS — K529 Noninfective gastroenteritis and colitis, unspecified: Secondary | ICD-10-CM | POA: Diagnosis not present

## 2017-06-18 DIAGNOSIS — R197 Diarrhea, unspecified: Secondary | ICD-10-CM | POA: Diagnosis not present

## 2017-06-18 DIAGNOSIS — K529 Noninfective gastroenteritis and colitis, unspecified: Secondary | ICD-10-CM | POA: Diagnosis not present

## 2017-06-26 DIAGNOSIS — H524 Presbyopia: Secondary | ICD-10-CM | POA: Diagnosis not present

## 2017-06-26 DIAGNOSIS — E119 Type 2 diabetes mellitus without complications: Secondary | ICD-10-CM | POA: Diagnosis not present

## 2017-06-26 DIAGNOSIS — H52223 Regular astigmatism, bilateral: Secondary | ICD-10-CM | POA: Diagnosis not present

## 2017-06-26 DIAGNOSIS — H5203 Hypermetropia, bilateral: Secondary | ICD-10-CM | POA: Diagnosis not present

## 2017-07-09 DIAGNOSIS — K529 Noninfective gastroenteritis and colitis, unspecified: Secondary | ICD-10-CM | POA: Diagnosis not present

## 2017-08-29 ENCOUNTER — Ambulatory Visit: Payer: Medicare Other | Attending: Family Medicine | Admitting: Audiology

## 2017-09-13 DIAGNOSIS — F209 Schizophrenia, unspecified: Secondary | ICD-10-CM | POA: Diagnosis not present

## 2017-09-13 DIAGNOSIS — F72 Severe intellectual disabilities: Secondary | ICD-10-CM | POA: Diagnosis not present

## 2017-10-20 IMAGING — US US ABDOMEN LIMITED
1 series · 14 of 25 positions shown · non-contrast
Comparison: None.

CLINICAL DATA: Elevated liver function tests.

EXAM:
ULTRASOUND ABDOMEN LIMITED RIGHT UPPER QUADRANT

[Series 1: us abdomen limited · 0.15mm/px · 14 of 40 slices shown]
[im 1/40]
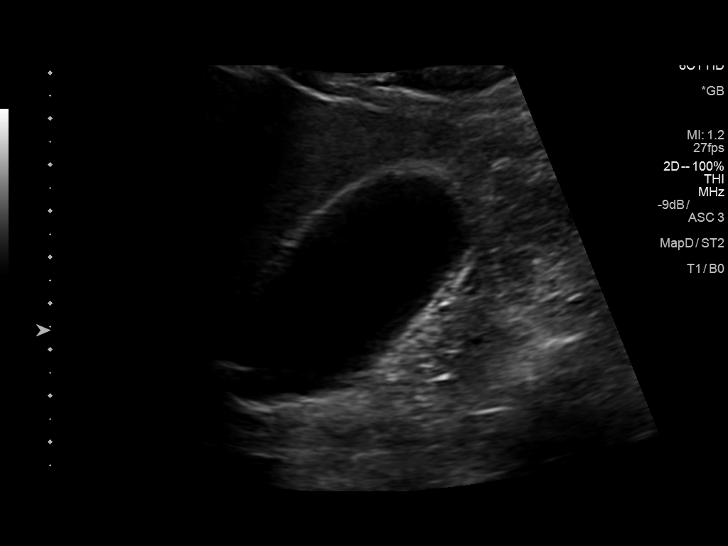
[im 4/40]
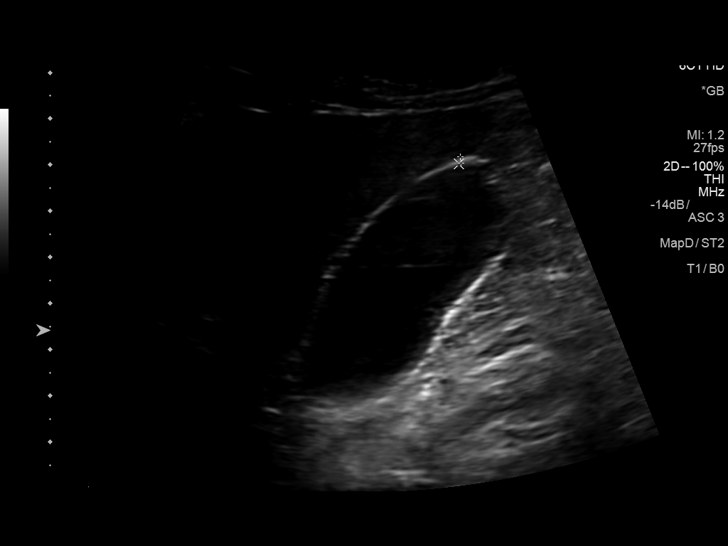
[im 7/40]
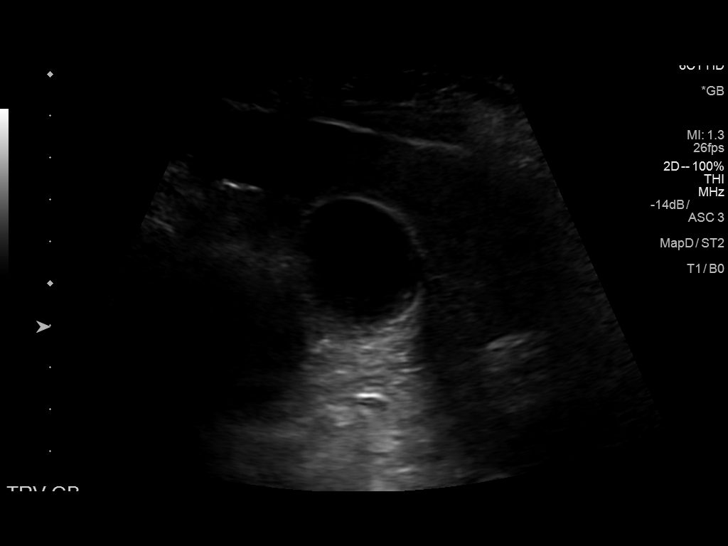
[im 10/40]
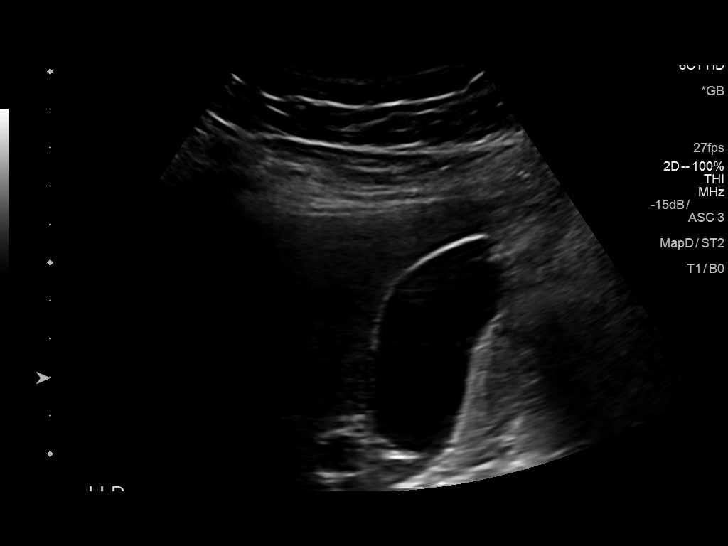
[im 14/40]
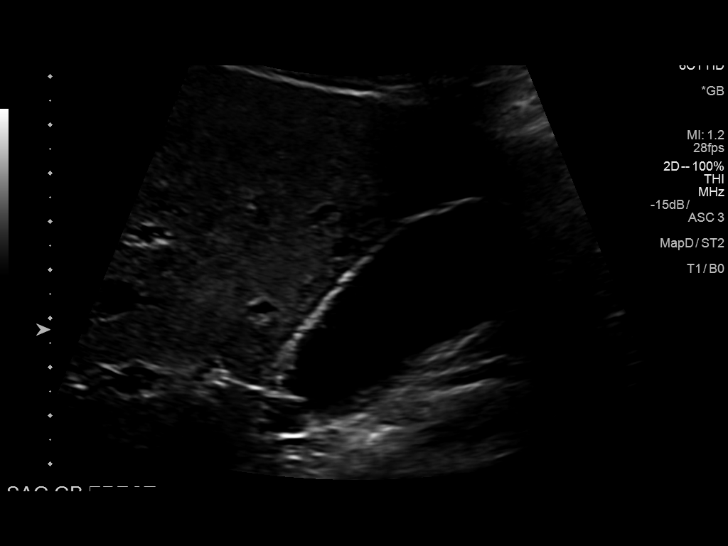
[im 15/40]
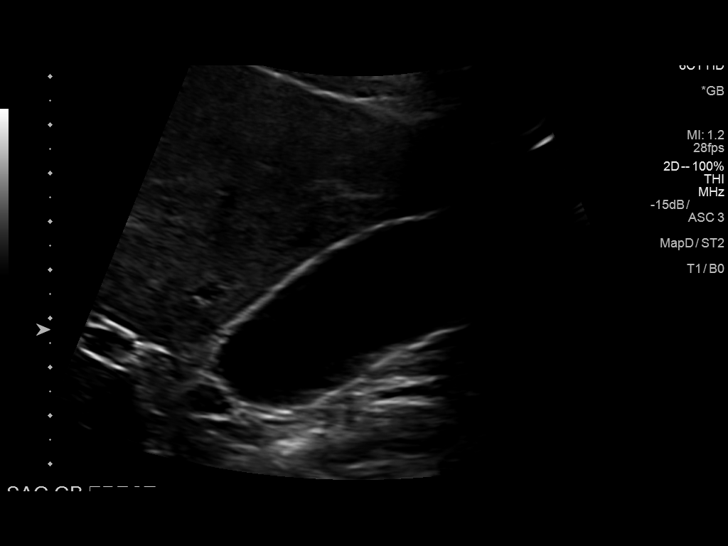
[im 18/40]
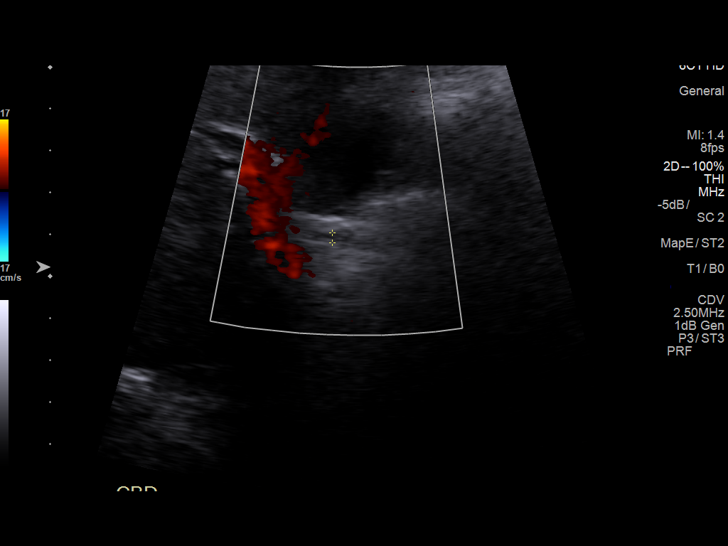
[im 22/40]
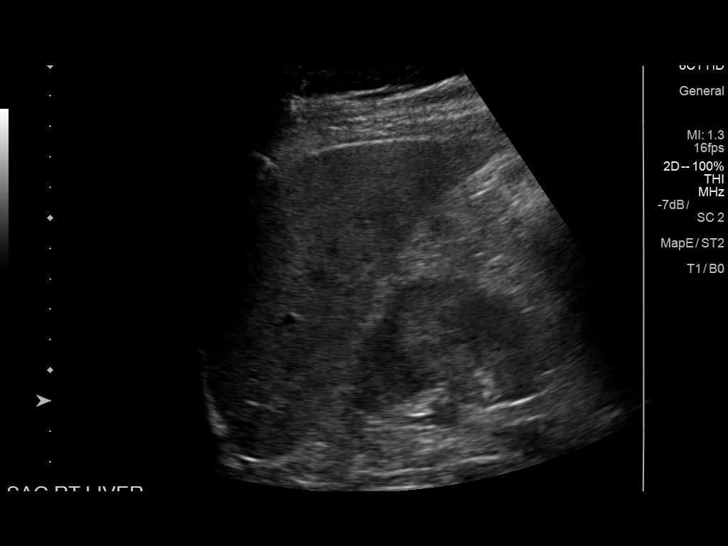
[im 25/40]
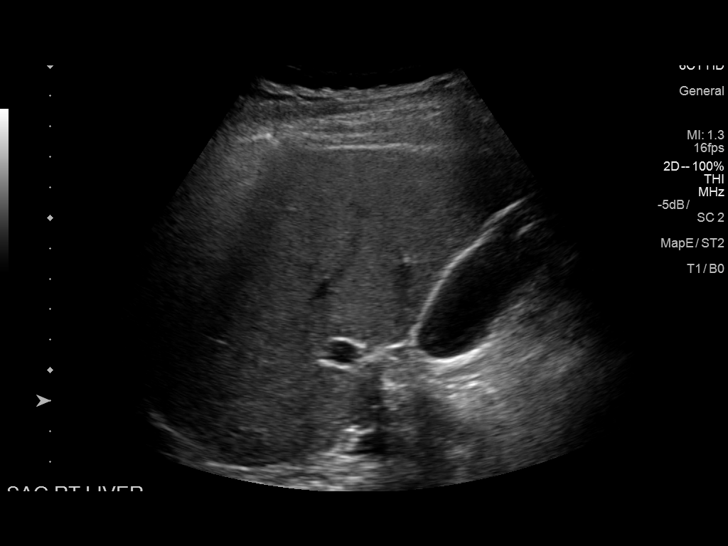
[im 27/40]
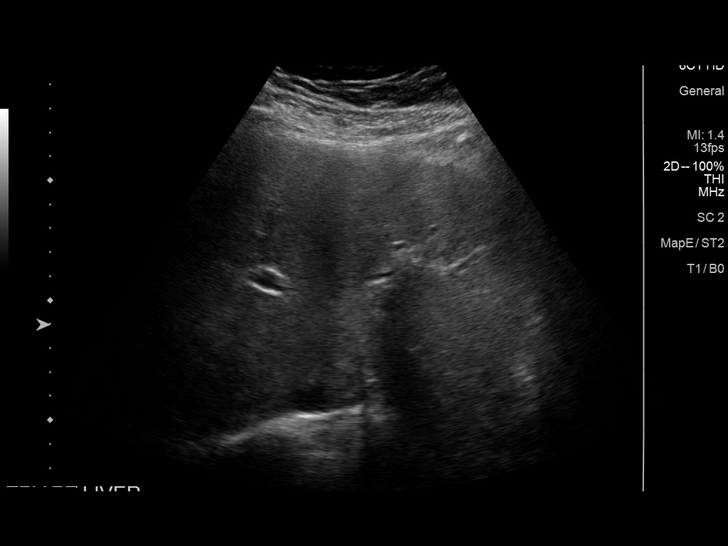
[im 30/40]
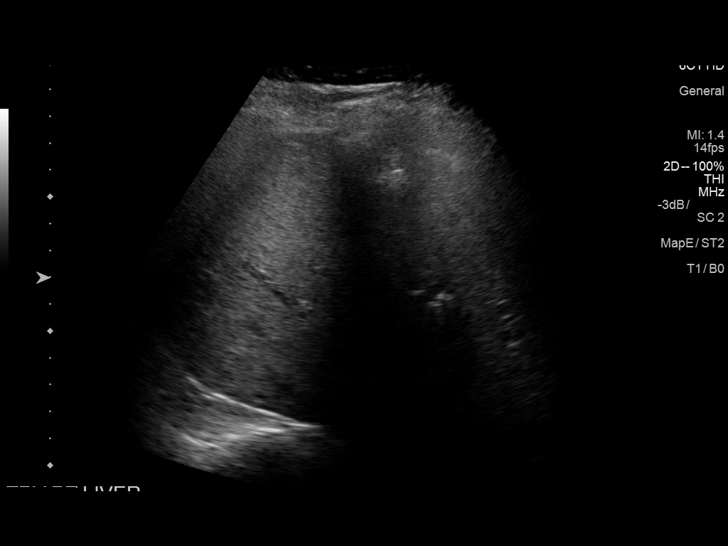
[im 33/40]
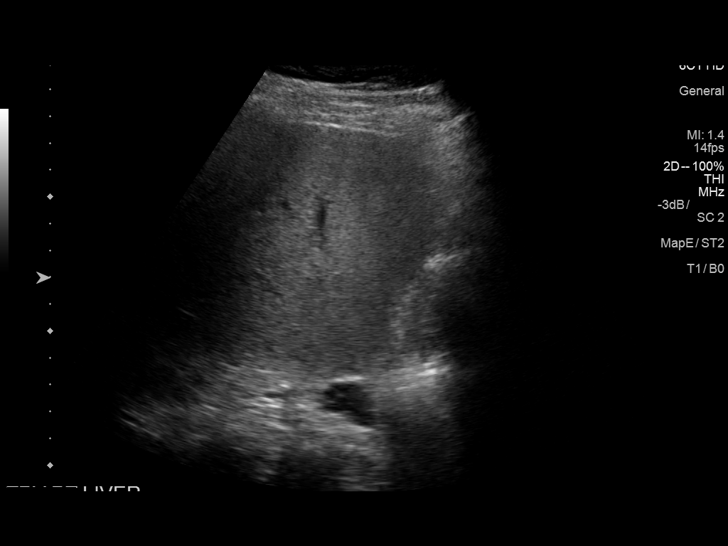
[im 36/40]
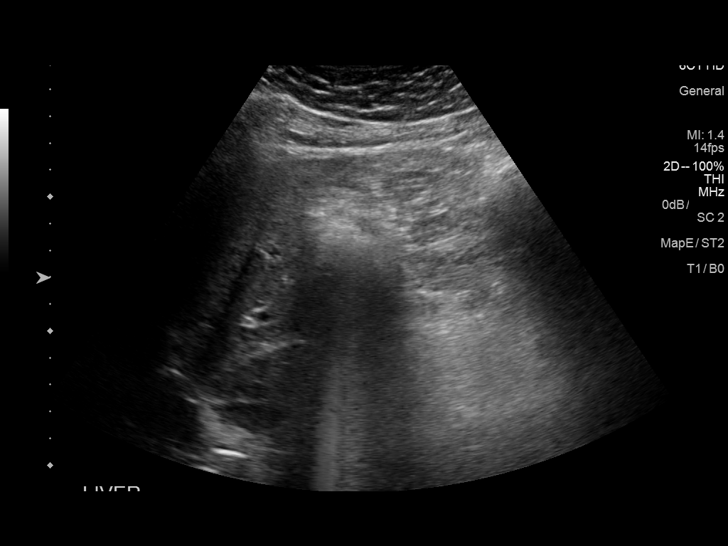
[im 40/40]
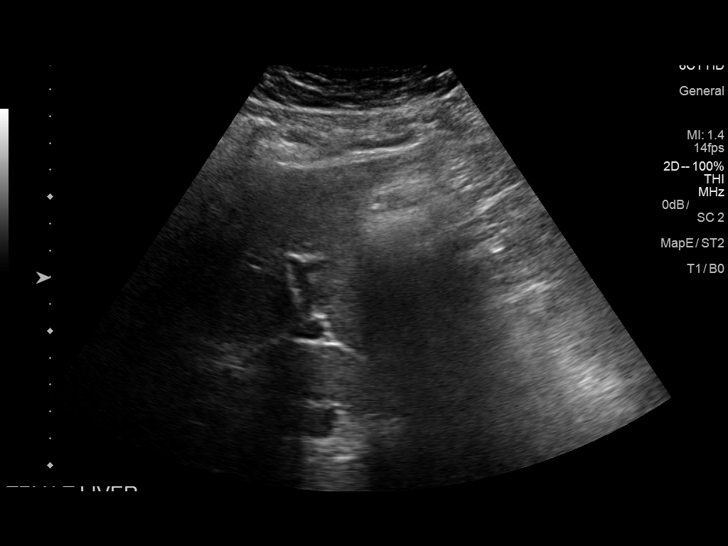

[14 of 25 positions shown; findings below may reference images not displayed]

FINDINGS: Gallbladder:

No gallstones or wall thickening visualized. No sonographic Murphy
sign noted by sonographer.

Common bile duct:

Diameter: 2.4 mm, normal.

Liver:

No focal lesion identified. Within normal limits in parenchymal
echogenicity.
IMPRESSION: Normal exam.

## 2017-11-20 DIAGNOSIS — Z79899 Other long term (current) drug therapy: Secondary | ICD-10-CM | POA: Diagnosis not present

## 2017-11-20 DIAGNOSIS — F209 Schizophrenia, unspecified: Secondary | ICD-10-CM | POA: Diagnosis not present

## 2017-11-20 DIAGNOSIS — Z23 Encounter for immunization: Secondary | ICD-10-CM | POA: Diagnosis not present

## 2017-12-26 DIAGNOSIS — F209 Schizophrenia, unspecified: Secondary | ICD-10-CM | POA: Diagnosis not present

## 2018-04-09 DIAGNOSIS — F79 Unspecified intellectual disabilities: Secondary | ICD-10-CM | POA: Diagnosis not present

## 2018-04-09 DIAGNOSIS — F209 Schizophrenia, unspecified: Secondary | ICD-10-CM | POA: Diagnosis not present

## 2018-04-09 DIAGNOSIS — R739 Hyperglycemia, unspecified: Secondary | ICD-10-CM | POA: Diagnosis not present

## 2018-04-09 DIAGNOSIS — E785 Hyperlipidemia, unspecified: Secondary | ICD-10-CM | POA: Diagnosis not present

## 2018-04-09 DIAGNOSIS — Z79891 Long term (current) use of opiate analgesic: Secondary | ICD-10-CM | POA: Diagnosis not present

## 2018-05-03 DIAGNOSIS — K529 Noninfective gastroenteritis and colitis, unspecified: Secondary | ICD-10-CM | POA: Diagnosis not present

## 2018-07-08 DIAGNOSIS — K529 Noninfective gastroenteritis and colitis, unspecified: Secondary | ICD-10-CM | POA: Diagnosis not present

## 2018-07-08 DIAGNOSIS — R197 Diarrhea, unspecified: Secondary | ICD-10-CM | POA: Diagnosis not present

## 2018-07-12 DIAGNOSIS — R197 Diarrhea, unspecified: Secondary | ICD-10-CM | POA: Diagnosis not present

## 2018-08-06 DIAGNOSIS — F209 Schizophrenia, unspecified: Secondary | ICD-10-CM | POA: Diagnosis not present

## 2018-08-06 DIAGNOSIS — F6381 Intermittent explosive disorder: Secondary | ICD-10-CM | POA: Diagnosis not present

## 2018-08-06 DIAGNOSIS — F72 Severe intellectual disabilities: Secondary | ICD-10-CM | POA: Diagnosis not present

## 2018-08-14 ENCOUNTER — Encounter: Payer: Self-pay | Admitting: Gastroenterology

## 2018-08-22 ENCOUNTER — Encounter: Payer: Self-pay | Admitting: Gastroenterology

## 2018-08-22 DIAGNOSIS — L748 Other eccrine sweat disorders: Secondary | ICD-10-CM | POA: Diagnosis not present

## 2018-08-22 DIAGNOSIS — B351 Tinea unguium: Secondary | ICD-10-CM | POA: Diagnosis not present

## 2018-08-29 ENCOUNTER — Ambulatory Visit (INDEPENDENT_AMBULATORY_CARE_PROVIDER_SITE_OTHER): Payer: Medicare Other | Admitting: Gastroenterology

## 2018-08-29 ENCOUNTER — Encounter: Payer: Self-pay | Admitting: Gastroenterology

## 2018-08-29 ENCOUNTER — Telehealth: Payer: Self-pay | Admitting: Gastroenterology

## 2018-08-29 VITALS — BP 108/76 | HR 80 | Ht 68.5 in | Wt 210.0 lb

## 2018-08-29 DIAGNOSIS — Z86010 Personal history of colonic polyps: Secondary | ICD-10-CM | POA: Diagnosis not present

## 2018-08-29 DIAGNOSIS — R194 Change in bowel habit: Secondary | ICD-10-CM

## 2018-08-29 MED ORDER — SOD PICOSULFATE-MAG OX-CIT ACD 10-3.5-12 MG-GM -GM/160ML PO SOLN: 1.0000 | ORAL | 0 refills | Status: DC

## 2018-08-29 NOTE — Patient Instructions (Addendum)
If you are age 58 or older, your body mass index should be between 23-30. Your Body mass index is 31.47 kg/m. If this is out of the aforementioned range listed, please consider follow up with your Primary Care Provider.  If you are age 67 or younger, your body mass index should be between 19-25. Your Body mass index is 31.47 kg/m. If this is out of the aformentioned range listed, please consider follow up with your Primary Care Provider.   You have been scheduled for a colonoscopy. Please follow written instructions given to you at your visit today.  Please pick up your prep supplies at the pharmacy within the next 1-3 days. If you use inhalers (even only as needed), please bring them with you on the day of your procedure. Your physician has requested that you go to www.startemmi.com and enter the access code given to you at your visit today. This web site gives a general overview about your procedure. However, you should still follow specific instructions given to you by our office regarding your preparation for the procedure.   Fiber Chart  You should be consuming 25-30g of fiber per day and drinking 8 glasses of water to help your bowels move regularly.  In the chart below you can look up how much fiber you are getting in an average day.  If you are not getting enough fiber, you should add a fiber supplement to your diet.  Examples of this include Metamucil, FiberCon and Citrucel.  These can be purchased at your local grocery store or pharmacy.      http://reyes-guerrero.com/.pdf Low FODMAP Diet: (Fermentable Oligosaccharides, Disaccharides, Monosaccharides, and Polyols) These are short chain carbohydrates and sugar alcohols that are poorly absorbed by the body, resulting in multiple abdominal symptoms, including changes in bowel habits, abdominal pain/discomfort, bloating, abdominal distension, gas, etc.       It was a pleasure to see you  today!  Tayva Easterday, D.O.

## 2018-08-29 NOTE — Progress Notes (Signed)
Chief Complaint: Change in bowel habits, increased frequency  Referring Provider:   Ferd Hibbs, ANP     HPI:     Micheal Vazquez is a 57 y.o. male referred to the [facility] from [place] for evaluation of increased stool frequency.  History provided by caregiver. He states he is having more frequent stools, urgency and occasional fecal seepage/incontinence. Has been ongoing for nearly 1 year. Worse with eating fruits or sugary drinks or greasy foods. BM within 30-60 mins of eating those exacerbating foods. Caregiver tries to avoid those foods.  Having between 3 and 5 bowel movements per day.  Will stop the car on trips to go to restroom.  Occasionally will go to bathroom but not have a BM. No diarrhea or loose stools, just increased frequency and urgency.  No hematochezia or melena and no associated abdominal pain.  No nausea, vomiting, fever, chills.  No p.o. intolerance or early satiety.  No associated weight loss.  Per review of provided notes, was prescribed Metamucil prn in May 2019 but no longer taking this.  Per review of notes, no change in stools as of follow-up appointment in July with PCM.  Was prescribed Questran in July, but no reported change in symptoms today.  Evaluation to date has been unremarkable to include TTG IgA <2, negative C. difficile, fecal leukocytes, ova parasite, fecal fat, normal CRP and ESR, normal mag, phos in July.  Labs in May 2018 notable for elevated AST/ALT 58/98 otherwise normal protein, albumin, bilirubin, alk phos normal CBC at that time.  Normalization of liver enzymes in November 2018 with ALT of 25 (remainder of hepatic panel not provided with today's documents).  Provided notes also document normal CMP in April 2019.  RUQ ultrasound normal in June 2018.  He has a history of GERD which is well controlled on ranitidine 150 mg.  No breakthrough symptoms.  Last colonoscopy was 2014 (versus 2012 per review of  notes) at an outside facility but report in EHR notable for tubular adenoma.    Past Medical History:  Diagnosis Date  . GERD (gastroesophageal reflux disease)   . Hyperglycemia   . Impaired fasting glucose   . Irritable bowel syndrome with diarrhea   . Moderate mental handicap   . Obesity   . Schizophrenia ([place])   . Thyroid disease      Past Surgical History:  Procedure Laterality Date  . MOUTH SURGERY     2018   History reviewed. No pertinent family history. Social History   Tobacco Use  . Smoking status: Never Smoker  . Smokeless tobacco: Never Used  Substance Use Topics  . Alcohol use: Never    Frequency: Never  . Drug use: Never   Current Outpatient Medications  Medication Sig Dispense Refill  . cholestyramine (QUESTRAN) 4 g packet Take 4 g by mouth 2 (two) times daily.    . divalproex (DEPAKOTE) 250 MG DR tablet Take 250 mg by mouth 2 (two) times daily.    Marland Kitchen EPINEPHrine (EPIPEN IJ) Inject 0.3 mg as directed as needed.    . ranitidine (ZANTAC) 150 MG tablet Take 1 tablet by mouth daily.    . risperiDONE (RISPERDAL) 2 MG tablet Take 2 mg by mouth 2 (two) times daily.    . TRINTELLIX 5 MG TABS tablet Take 1 tablet by mouth daily.     No current facility-administered medications for this visit.  Allergies  Allergen Reactions  . Other     Bee Stings  . Valium [Diazepam]     unknown     Review of Systems: All systems reviewed and negative except where noted in HPI.     Physical Exam:    Wt Readings from Last 3 Encounters:  08/29/18 210 lb (95.3 kg)  02/12/15 144 lb 8 oz (65.5 kg)  08/12/14 130 lb (59 kg)    BP 108/76   Pulse 80   Ht 5' 8.5" (1.74 m)   Wt 210 lb (95.3 kg)   BMI 31.47 kg/m  Constitutional:  Pleasant, in no acute distress. EENT: Pupils normal.  Conjunctivae are normal. No scleral icterus. Neck supple. No cervical LAD. Cardiovascular: Normal rate, regular rhythm. No edema Pulmonary/chest: Effort normal and breath sounds normal.  No wheezing, rales or rhonchi. Abdominal: Soft, nondistended, nontender. Bowel sounds active throughout. There are no masses palpable. No hepatomegaly. Neurological: Alert and oriented to person place and time. Skin: Skin is warm and dry. No rashes noted.   ASSESSMENT AND PLAN;   1) change in bowel habits: Clinical presentation seems most consistent with IBS given postprandial nature of symptoms, urgency, duration of symptoms.  Additional etiologies include medication induced.   - Low FODMAP diet -Increase dietary fiber.  If unable to do so, or limited clinical benefit, recommend adding daily fiber supplement (i.e. Citrucel) -Provided with a handout for both low FODMAP diet as well as fiber supplementation - As he is not having diarrhea, recommend against the use of Imodium or other motility agent at this time -Can evaluate for alternate mucosal or luminal etiology at time of colonoscopy as below.  We will plan on random and directed biopsies at that time to rule out microscopic colitis, although lower index of suspicion - Can likely stop Questran at this time given no perceived clinical benefit by patient or caregiver.  2) history of colon polyps/tubular adenoma: Last colonoscopy 2014 per EMR and notable for single tubular adenoma.  Unclear of size of polyp resected, but given histology recommend repeat colonoscopy at this time for ongoing surveillance.  3) GERD: Symptoms well controlled with daily ranitidine.  No change in therapy at this time.  Continue to avoid exacerbating foods.  The indications, risks, and benefits of colonoscopy were explained to the patient and his caregiver in detail. Risks include but are not limited to bleeding, perforation, adverse reaction to medications, and cardiopulmonary compromise. Sequelae include but are not limited to the possibility of surgery, hospitalization, and mortality. The patient verbalized understanding and wished to proceed. All questions  answered, referred to the scheduler and bowel prep ordered. Further recommendations pending results of the exam.    Lavena Bullion, DO, FACG  08/29/2018, 11:12 AM   [name], *

## 2018-09-06 NOTE — Telephone Encounter (Signed)
Unable to reach patient via phone. Will send patient a letter. Regarding Colonoscopy Prep.

## 2018-09-18 ENCOUNTER — Telehealth: Payer: Self-pay | Admitting: Gastroenterology

## 2018-09-18 NOTE — Telephone Encounter (Signed)
[facility] calling to inform that clenpiq needs PA.

## 2018-09-24 NOTE — Telephone Encounter (Signed)
Still unable to reach the patient. Via telephone and mail.

## 2018-09-26 NOTE — Telephone Encounter (Signed)
Patient's phone does not have a voice mail the picks up and it cuts off after one ring. Unable to reach the patient.

## 2018-09-30 ENCOUNTER — Telehealth: Payer: Self-pay | Admitting: Gastroenterology

## 2018-09-30 NOTE — Telephone Encounter (Signed)
Care partner Mrs.Micheal Vazquez states this has still not been taking care of and patient has been unable to get prep. Patient procedure was rescheduled to 10.30.19 but still needs prep kit. Please call care taker Mrs.Smith back when completed.

## 2018-09-30 NOTE — Telephone Encounter (Signed)
Micheal Vazquez was called on 09/26/2018 and a message was left on her voicemail after numerous attempts to contact via phone and via letter. There is a sample of the prep for the patients Colonoscopy at the front desk of our office that has been there since 09/06/2018. I have called again today to leave [name] a message for please come by the office to pick up the patients prep. I asked her to please call me back to confirm, receipt of message.

## 2018-09-30 NOTE — Telephone Encounter (Signed)
Pharmacy calling regarding clenpiq, insurance does not cover it.

## 2018-10-02 ENCOUNTER — Encounter: Payer: Medicare Other | Admitting: Gastroenterology

## 2018-10-16 DIAGNOSIS — F72 Severe intellectual disabilities: Secondary | ICD-10-CM | POA: Diagnosis not present

## 2018-10-16 DIAGNOSIS — F209 Schizophrenia, unspecified: Secondary | ICD-10-CM | POA: Diagnosis not present

## 2018-10-16 DIAGNOSIS — F6381 Intermittent explosive disorder: Secondary | ICD-10-CM | POA: Diagnosis not present

## 2018-10-23 ENCOUNTER — Encounter: Payer: Self-pay | Admitting: Gastroenterology

## 2018-10-23 ENCOUNTER — Ambulatory Visit (AMBULATORY_SURGERY_CENTER): Payer: Medicare Other | Admitting: Gastroenterology

## 2018-10-23 VITALS — BP 124/79 | HR 63 | Temp 97.7°F | Resp 23 | Ht 68.0 in | Wt 210.0 lb

## 2018-10-23 DIAGNOSIS — K635 Polyp of colon: Secondary | ICD-10-CM | POA: Diagnosis not present

## 2018-10-23 DIAGNOSIS — D124 Benign neoplasm of descending colon: Secondary | ICD-10-CM | POA: Diagnosis not present

## 2018-10-23 DIAGNOSIS — R194 Change in bowel habit: Secondary | ICD-10-CM

## 2018-10-23 DIAGNOSIS — D123 Benign neoplasm of transverse colon: Secondary | ICD-10-CM | POA: Diagnosis not present

## 2018-10-23 DIAGNOSIS — D120 Benign neoplasm of cecum: Secondary | ICD-10-CM

## 2018-10-23 MED ORDER — SODIUM CHLORIDE 0.9 % IV SOLN: 500.0000 mL | Freq: Once | INTRAVENOUS | Status: DC

## 2018-10-23 NOTE — Progress Notes (Signed)
Called to room to assist during endoscopic procedure.  Patient ID and intended procedure confirmed with present staff. Received instructions for my participation in the procedure from the performing physician.  

## 2018-10-23 NOTE — Progress Notes (Signed)
PT taken to PACU. Monitors in place. VSS. Report given to RN. 

## 2018-10-23 NOTE — Patient Instructions (Signed)
HANDOUTS GIVEN FOR POLYPS AND HIGH FIBER DIET  YOU HAD AN ENDOSCOPIC PROCEDURE TODAY AT [facility]:   Refer to the procedure report that was given to you for any specific questions about what was found during the examination.  If the procedure report does not answer your questions, please call your gastroenterologist to clarify.  If you requested that your care partner not be given the details of your procedure findings, then the procedure report has been included in a sealed envelope for you to review at your convenience later.  YOU SHOULD EXPECT: Some feelings of bloating in the abdomen. Passage of more gas than usual.  Walking can help get rid of the air that was put into your GI tract during the procedure and reduce the bloating. If you had a lower endoscopy (such as a colonoscopy or flexible sigmoidoscopy) you may notice spotting of blood in your stool or on the toilet paper. If you underwent a bowel prep for your procedure, you may not have a normal bowel movement for a few days.  Please Note:  You might notice some irritation and congestion in your nose or some drainage.  This is from the oxygen used during your procedure.  There is no need for concern and it should clear up in a day or so.  SYMPTOMS TO REPORT IMMEDIATELY:   Following lower endoscopy (colonoscopy or flexible sigmoidoscopy):  Excessive amounts of blood in the stool  Significant tenderness or worsening of abdominal pains  Swelling of the abdomen that is new, acute  Fever of 100F or higher  For urgent or emergent issues, a gastroenterologist can be reached at any hour by calling [phone].   DIET:  We do recommend a small meal at first, but then you may proceed to your regular diet.  Drink plenty of fluids but you should avoid alcoholic beverages for 24 hours.  ACTIVITY:  You should plan to take it easy for the rest of today and you should NOT DRIVE or use heavy machinery until tomorrow (because of  the sedation medicines used during the test).    FOLLOW UP: Our staff will call the number listed on your records the next business day following your procedure to check on you and address any questions or concerns that you may have regarding the information given to you following your procedure. If we do not reach you, we will leave a message.  However, if you are feeling well and you are not experiencing any problems, there is no need to return our call.  We will assume that you have returned to your regular daily activities without incident.  If any biopsies were taken you will be contacted by phone or by letter within the next 1-3 weeks.  Please call us at [phone] if you have not heard about the biopsies in 3 weeks.    SIGNATURES/CONFIDENTIALITY: You and/or your care partner have signed paperwork which will be entered into your electronic medical record.  These signatures attest to the fact that that the information above on your After Visit Summary has been reviewed and is understood.  Full responsibility of the confidentiality of this discharge information lies with you and/or your care-partner.

## 2018-10-23 NOTE — Op Note (Signed)
[facility] Patient Name: Micheal Vazquez Procedure Date: 10/23/2018 2:17 PM MRN: [id number] Endoscopist: Gerrit Heck , MD Age: 57 Referring MD:  Date of Birth: 1961-03-25 Gender: Male Account #: [id number] Procedure:                Colonoscopy Indications:              Surveillance: Personal history of adenomatous                            polyps on last colonoscopy 5 years ago.                            Additionally, he has a history of change in bowel                            habits described as increased stool frequency,                            urgency and episodes of fecal seepage. Medicines:                Monitored Anesthesia Care Procedure:                Pre-Anesthesia Assessment:                           - Prior to the procedure, a History and Physical                            was performed, and patient medications and                            allergies were reviewed. The patient's tolerance of                            previous anesthesia was also reviewed. The risks                            and benefits of the procedure and the sedation                            options and risks were discussed with the patient.                            All questions were answered, and informed consent                            was obtained. Prior Anticoagulants: The patient has                            taken no previous anticoagulant or antiplatelet                            agents. ASA Grade Assessment: III - A patient with  severe systemic disease. After reviewing the risks                            and benefits, the patient was deemed in                            satisfactory condition to undergo the procedure.                           After obtaining informed consent, the colonoscope                            was passed under direct vision. Throughout the                            procedure, the patient's blood pressure,  pulse, and                            oxygen saturations were monitored continuously. The                            Colonoscope was introduced through the anus and                            advanced to the the terminal ileum. The colonoscopy                            was performed without difficulty. The patient                            tolerated the procedure well. The quality of the                            bowel preparation was adequate. Scope In: 2:21:36 PM Scope Out: 2:35:30 PM Scope Withdrawal Time: 0 hours 12 minutes 18 seconds  Total Procedure Duration: 0 hours 13 minutes 54 seconds  Findings:                 The perianal and digital rectal examinations were                            normal.                           A 3 mm polyp was found in the cecum. The polyp was                            sessile. The polyp was removed with a cold biopsy                            forceps. Resection and retrieval were complete.                            Estimated blood loss was minimal.  Two sessile polyps were found in the descending                            colon and transverse colon. The polyps were 2 to 3                            mm in size. These polyps were removed with a cold                            biopsy forceps. Resection and retrieval were                            complete. Estimated blood loss was minimal.                           Normal mucosa was found in the entire colon.                            Biopsies for histology were taken with a cold                            forceps from the right colon and left colon for                            evaluation of microscopic colitis. Estimated blood                            loss was minimal.                           Retroflexion in the rectum was not performed due to                            anatomy.                           The terminal ileum appeared normal. Complications:             No immediate complications. Estimated Blood Loss:     Estimated blood loss was minimal. Impression:               - One 3 mm polyp in the cecum, removed with a cold                            biopsy forceps. Resected and retrieved.                           - Two 2 to 3 mm polyps in the descending colon and                            in the transverse colon, removed with a cold biopsy                            forceps. Resected  and retrieved.                           - Normal mucosa in the entire examined colon.                            Biopsied.                           - The examined portion of the ileum was normal. Recommendation:           - Patient has a contact number available for                            emergencies. The signs and symptoms of potential                            delayed complications were discussed with the                            patient. Return to normal activities tomorrow.                            Written discharge instructions were provided to the                            patient.                           - Resume previous diet today.                           - Continue present medications.                           - Await pathology results.                           - Repeat colonoscopy in 3 - 5 years for                            surveillance based on pathology results.                           - Return to GI clinic PRN.                           - Use fiber, for example Citrucel, Fibercon, Konsyl                            or Metamucil. Gerrit Heck, MD 10/23/2018 2:41:15 PM

## 2018-10-23 NOTE — Progress Notes (Signed)
I have reviewed the patient's medical history in detail and updated the computerized patient record.

## 2018-10-24 ENCOUNTER — Telehealth: Payer: Self-pay

## 2018-10-24 NOTE — Telephone Encounter (Signed)
Left message on answering machine. 

## 2018-10-24 NOTE — Telephone Encounter (Signed)
Attempted to reach pt. With follow-up call following endoscopic procedure 10/23/2018.  LM on pt. Voice mail.  Will try to reach pt. Again later today.

## 2018-10-29 ENCOUNTER — Encounter: Payer: Self-pay | Admitting: Gastroenterology

## 2018-11-15 DIAGNOSIS — Z23 Encounter for immunization: Secondary | ICD-10-CM | POA: Diagnosis not present

## 2018-11-15 DIAGNOSIS — R739 Hyperglycemia, unspecified: Secondary | ICD-10-CM | POA: Diagnosis not present

## 2018-11-15 DIAGNOSIS — Z79899 Other long term (current) drug therapy: Secondary | ICD-10-CM | POA: Diagnosis not present

## 2018-11-15 DIAGNOSIS — E785 Hyperlipidemia, unspecified: Secondary | ICD-10-CM | POA: Diagnosis not present

## 2018-11-15 DIAGNOSIS — F209 Schizophrenia, unspecified: Secondary | ICD-10-CM | POA: Diagnosis not present

## 2019-01-01 DIAGNOSIS — F72 Severe intellectual disabilities: Secondary | ICD-10-CM | POA: Diagnosis not present

## 2019-01-01 DIAGNOSIS — F6381 Intermittent explosive disorder: Secondary | ICD-10-CM | POA: Diagnosis not present

## 2019-01-01 DIAGNOSIS — F209 Schizophrenia, unspecified: Secondary | ICD-10-CM | POA: Diagnosis not present

## 2019-04-10 DIAGNOSIS — F209 Schizophrenia, unspecified: Secondary | ICD-10-CM | POA: Diagnosis not present

## 2019-04-10 DIAGNOSIS — F72 Severe intellectual disabilities: Secondary | ICD-10-CM | POA: Diagnosis not present

## 2019-04-10 DIAGNOSIS — F6381 Intermittent explosive disorder: Secondary | ICD-10-CM | POA: Diagnosis not present

## 2019-04-17 ENCOUNTER — Telehealth: Payer: Self-pay | Admitting: Gastroenterology

## 2019-04-17 MED ORDER — OMEPRAZOLE 20 MG PO CPDR: 20.0000 mg | DELAYED_RELEASE_CAPSULE | Freq: Every day | ORAL | 5 refills | Status: DC

## 2019-04-17 NOTE — Telephone Encounter (Signed)
Pts caregiver states that the pt was taking zantac for his stomach but this was changed to pepcid after zantac was recalled. She reports the pepcid has caused him to have diarrhea. She states he is not taking any other meds for his stomach. Per caregiver he is not taking questran either. Please advise.

## 2019-04-17 NOTE — Telephone Encounter (Signed)
Caregiver aware and script sent to pharmacy.

## 2019-04-17 NOTE — Telephone Encounter (Signed)
Okay to change from the Pepcid to omeprazole 20 mg daily.  Recommend taking 30 to 60 minutes before breakfast for control of reflux.  #90.  Refill 5.  Thank you.

## 2019-04-18 ENCOUNTER — Telehealth: Payer: Self-pay | Admitting: Gastroenterology

## 2019-04-18 NOTE — Telephone Encounter (Signed)
Spoke with pharmacist, explained that patient is no longer taking pepcid or zantac. She verbalized understanding and will note it on his file

## 2019-04-18 NOTE — Telephone Encounter (Signed)
Sonia Baller from [facility] called would like to know if the patient will be replacing a medication for the [facility] since he is already on a similar med. Sonia Baller call back # [phone]

## 2019-07-28 DIAGNOSIS — F209 Schizophrenia, unspecified: Secondary | ICD-10-CM | POA: Diagnosis not present

## 2019-07-28 DIAGNOSIS — F6381 Intermittent explosive disorder: Secondary | ICD-10-CM | POA: Diagnosis not present

## 2019-07-28 DIAGNOSIS — F72 Severe intellectual disabilities: Secondary | ICD-10-CM | POA: Diagnosis not present

## 2019-10-01 DIAGNOSIS — E785 Hyperlipidemia, unspecified: Secondary | ICD-10-CM | POA: Diagnosis not present

## 2019-10-01 DIAGNOSIS — R739 Hyperglycemia, unspecified: Secondary | ICD-10-CM | POA: Diagnosis not present

## 2019-10-01 DIAGNOSIS — F209 Schizophrenia, unspecified: Secondary | ICD-10-CM | POA: Diagnosis not present

## 2019-10-01 DIAGNOSIS — Z23 Encounter for immunization: Secondary | ICD-10-CM | POA: Diagnosis not present

## 2020-01-09 ENCOUNTER — Ambulatory Visit (INDEPENDENT_AMBULATORY_CARE_PROVIDER_SITE_OTHER): Payer: Medicare Other | Admitting: Gastroenterology

## 2020-01-09 ENCOUNTER — Other Ambulatory Visit: Payer: Self-pay

## 2020-01-09 ENCOUNTER — Encounter: Payer: Self-pay | Admitting: Gastroenterology

## 2020-01-09 ENCOUNTER — Ambulatory Visit: Payer: Medicare Other | Admitting: Gastroenterology

## 2020-01-09 VITALS — BP 128/74 | HR 79 | Temp 98.2°F | Ht 69.0 in | Wt 199.2 lb

## 2020-01-09 DIAGNOSIS — K219 Gastro-esophageal reflux disease without esophagitis: Secondary | ICD-10-CM

## 2020-01-09 DIAGNOSIS — Z86010 Personal history of colonic polyps: Secondary | ICD-10-CM

## 2020-01-09 DIAGNOSIS — K589 Irritable bowel syndrome without diarrhea: Secondary | ICD-10-CM

## 2020-01-09 DIAGNOSIS — R194 Change in bowel habit: Secondary | ICD-10-CM

## 2020-01-09 NOTE — Patient Instructions (Addendum)
Follow up as needed  It was a pleasure to see you today!  Vito Cirigliano, D.O.

## 2020-01-09 NOTE — Progress Notes (Signed)
P  Chief Complaint:    Change in bowel habits, IBS  GI History: 59 year old male with a history of schizophrenia, GERD, follows in the GI clinic for IBS-D.  Extensive evaluation to date to include the following: -06/2018: TTG IgA <2, negative C. difficile, fecal leukocytes, ova parasite, fecal fat, normal CRP and ESR -04/2017:AST/ALT 58/98 otherwise normal protein, albumin, bilirubin, alk phos normal CBC at that time.  Normalization of liver enzymes in November 2018 with ALT of 25 and normal in 03/2018 -05/2017: Normal RUQ [place] -Colonoscopy (09/2018, Dr. Bryan Lemma): 2 subcentimeter tubular adenomas, otherwise normal with biopsies negative for [facility].  Normal TI.  Repeat 5 years  Longstanding history of reflux, well controlled with Prilosec 20 mg/day  HPI:     Patient is a 59 y.o. male presenting to the Gastroenterology Clinic for follow-up.  Presents with his caretaker who provides his history.  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  Per caretaker present today, and present with him most times (including all prior appointments), this is his baseline.  This is all unchanged.  Otherwise, good p.o. intake, weight is stable, no hematochezia, and no other concerns today.  Stools are otherwise formed, without diarrhea or constipation.  Denies any abdominal pain.    Review of systems:     No chest pain, no SOB, no fevers, no urinary sx   Past Medical History:  Diagnosis Date  . GERD (gastroesophageal reflux disease)   . Hyperglycemia   . Impaired fasting glucose   . Irritable bowel syndrome with diarrhea   . Moderate mental handicap   . Obesity   . Schizophrenia ([place])   . Thyroid disease     Patient's surgical history, family medical history, social history, medications and allergies were all reviewed in Epic    Current  Outpatient Medications  Medication Sig Dispense Refill  . atorvastatin (LIPITOR) 10 MG tablet Take 10 mg by mouth daily.    Marland Kitchen bismuth subsalicylate (PEPTO-BISMOL) 262 MG/15ML suspension Take 30 mLs by mouth as needed.    . divalproex (DEPAKOTE) 250 MG DR tablet Take 250 mg by mouth 2 (two) times daily.    Marland Kitchen omeprazole (PRILOSEC) 20 MG capsule Take 1 capsule (20 mg total) by mouth daily. 90 capsule 5  . risperiDONE (RISPERDAL) 2 MG tablet Take 2 mg by mouth 2 (two) times daily.    . TRINTELLIX 5 MG TABS tablet Take 1 tablet by mouth daily.    . cholestyramine (QUESTRAN) 4 g packet Take 4 g by mouth 2 (two) times daily.    Marland Kitchen EPINEPHrine (EPIPEN IJ) Inject 0.3 mg as directed as needed.     No current facility-administered medications for this visit.    Physical Exam:     BP 128/74   Pulse 79   Temp 98.2 F (36.8 C)   Ht '5\' 9"'  (1.753 m)   Wt 199 lb 4 oz (90.4 kg)   BMI 29.42 kg/m   GENERAL:  Pleasant male in NAD PSYCH: : Cooperative, normal affect EENT:  conjunctiva pink, mucous membranes moist, neck supple without masses CARDIAC:  RRR, no murmur heard, no peripheral edema PULM: Normal respiratory effort, lungs CTA bilaterally, no wheezing ABDOMEN:  Nondistended, soft, nontender. No obvious masses, no hepatomegaly,  normal bowel sounds SKIN:  turgor, no lesions seen Musculoskeletal:  Normal muscle tone,  normal strength NEURO: Alert and oriented x 3, no focal neurologic deficits   IMPRESSION and PLAN:    1) IBS: -Symptoms all stable from previous.  Has had an extensive evaluation for these symptoms, all essentially unremarkable -Continue current good p.o. intake.  Encouraged increased dietary fiber and decreasing soda intake -Filled out carehome paperwork  2) History of colon polyps: -Colonoscopy in 2019 with 2 subcentimeter tubular adenomas -Repeat colonoscopy in 2024 for ongoing polyp surveillance  3) GERD: -Well-controlled on current therapy.  No change to regimen at  this time  RTC as needed  I spent 22 minutes of time, including independent review of results as outlined above, communicating results with the patient directly, face-to-face time with the patient, coordinating care, ordering studies and medications as appropriate, and documentation.              [place] ,DO, FACG 01/09/2020, 11:14 AM

## 2020-02-16 DIAGNOSIS — F72 Severe intellectual disabilities: Secondary | ICD-10-CM | POA: Diagnosis not present

## 2020-02-16 DIAGNOSIS — F209 Schizophrenia, unspecified: Secondary | ICD-10-CM | POA: Diagnosis not present

## 2020-02-16 DIAGNOSIS — F6381 Intermittent explosive disorder: Secondary | ICD-10-CM | POA: Diagnosis not present

## 2020-03-04 DIAGNOSIS — Z23 Encounter for immunization: Secondary | ICD-10-CM | POA: Diagnosis not present

## 2020-03-09 DIAGNOSIS — F209 Schizophrenia, unspecified: Secondary | ICD-10-CM | POA: Diagnosis not present

## 2020-03-09 DIAGNOSIS — F6381 Intermittent explosive disorder: Secondary | ICD-10-CM | POA: Diagnosis not present

## 2020-03-09 DIAGNOSIS — F72 Severe intellectual disabilities: Secondary | ICD-10-CM | POA: Diagnosis not present

## 2020-04-01 DIAGNOSIS — Z23 Encounter for immunization: Secondary | ICD-10-CM | POA: Diagnosis not present

## 2020-04-15 ENCOUNTER — Other Ambulatory Visit: Payer: Self-pay

## 2020-04-15 MED ORDER — OMEPRAZOLE 20 MG PO CPDR: 20.0000 mg | DELAYED_RELEASE_CAPSULE | Freq: Every day | ORAL | 5 refills | Status: DC

## 2020-05-13 ENCOUNTER — Ambulatory Visit: Payer: Medicare Other | Admitting: Gastroenterology

## 2020-07-08 ENCOUNTER — Encounter: Payer: Self-pay | Admitting: Gastroenterology

## 2020-07-08 ENCOUNTER — Ambulatory Visit (INDEPENDENT_AMBULATORY_CARE_PROVIDER_SITE_OTHER): Payer: Medicare Other | Admitting: Gastroenterology

## 2020-07-08 VITALS — BP 120/72 | HR 83 | Ht 69.0 in | Wt 205.1 lb

## 2020-07-08 DIAGNOSIS — K589 Irritable bowel syndrome without diarrhea: Secondary | ICD-10-CM | POA: Diagnosis not present

## 2020-07-08 DIAGNOSIS — K219 Gastro-esophageal reflux disease without esophagitis: Secondary | ICD-10-CM | POA: Diagnosis not present

## 2020-07-08 DIAGNOSIS — Z86010 Personal history of colonic polyps: Secondary | ICD-10-CM | POA: Diagnosis not present

## 2020-07-08 DIAGNOSIS — R152 Fecal urgency: Secondary | ICD-10-CM

## 2020-07-08 NOTE — Progress Notes (Signed)
P  Chief Complaint:    IBS  GI History: 59 year old male with a history of schizophrenia, GERD, follows in the GI clinic for IBS-D.  Extensive evaluation to date to include the following: -06/2018: TTG IgA<2,negative C. difficile, fecal leukocytes, ova parasite, fecal fat, normal CRP and ESR -04/2017:AST/ALT 58/98 otherwise normal protein, albumin, bilirubin, alk phos normal CBC at that time. Normalization of liver enzymes in November 2018 with ALT of 25 and normal in 03/2018 -05/2017: Normal RUQ [place] -Colonoscopy (09/2018, Dr. Bryan Lemma): 2 subcentimeter tubular adenomas, otherwise normal with biopsies negative for [facility].  Normal TI.  Repeat 5 years  Longstanding history of reflux, well controlled with Prilosec 20 mg/day   HPI:     Patient is a 59 y.o. male presenting to the Gastroenterology Clinic for follow-up.  Was last seen by me on 01/12/2020.  He again presents with his caretaker who provides his history.  He states that every time he drinks things like [employer] or greasy foods that triggers fecal urgency.  No incontinence or fecal seepage.  Stools are formed.  No hematochezia or melena.  Symptoms overall unchanged from his prior history, but his care at home operator wanted an to keep close tabs w/ GI.   Otherwise good p.o. intake.  His weight is stable-has lost 3#since going back to the gym.  Exercising regularly.    Was seen by his PCM, Dr. Arelia Sneddon, recently- no medication changes.   Has completed Covid vaccine series.    Review of systems:     No chest pain, no SOB, no fevers, no urinary sx   Past Medical History:  Diagnosis Date  . GERD (gastroesophageal reflux disease)   . Hyperglycemia   . Impaired fasting glucose   . Irritable bowel syndrome with diarrhea   . Moderate mental handicap   . Obesity   . Schizophrenia ([place])   . Thyroid disease     Patient's surgical history, family medical history, social history, medications and allergies were all reviewed  in Epic    Current Outpatient Medications  Medication Sig Dispense Refill  . atorvastatin (LIPITOR) 10 MG tablet Take 10 mg by mouth daily.    Marland Kitchen bismuth subsalicylate (PEPTO-BISMOL) 262 MG/15ML suspension Take 30 mLs by mouth as needed.    . cholestyramine (QUESTRAN) 4 g packet Take 4 g by mouth as needed.     . divalproex (DEPAKOTE) 250 MG DR tablet Take 250 mg by mouth 2 (two) times daily.    Marland Kitchen EPINEPHrine (EPIPEN IJ) Inject 0.3 mg as directed as needed.    Marland Kitchen omeprazole (PRILOSEC) 20 MG capsule Take 1 capsule (20 mg total) by mouth daily. 90 capsule 5  . risperiDONE (RISPERDAL) 2 MG tablet Take 2 mg by mouth 2 (two) times daily.    Marland Kitchen vortioxetine HBr (TRINTELLIX) 5 MG TABS tablet Take 5 mg by mouth daily.     No current facility-administered medications for this visit.    Physical Exam:     BP 120/72   Pulse 83   Ht _0  (1.753 m)   Wt 205 lb 2 oz (93 kg)   BMI 30.29 kg/m   GENERAL:  Pleasant male in NAD PSYCH: : Cooperative, normal affect EENT:  conjunctiva pink, mucous membranes moist, neck supple without masses CARDIAC:  RRR, no M murmur heard, no peripheral edema PULM: Normal respiratory effort, lungs CTA bilaterally, no wheezing ABDOMEN:  Nondistended, soft, nontender. No obvious masses, no hepatomegaly,  normal bowel sounds SKIN:  turgor,  no lesions seen Musculoskeletal:  Normal muscle tone, normal strength   IMPRESSION and PLAN:    1) IBS/fecal urgency: -Symptoms all stable from previous.  Has had an extensive evaluation for these symptoms, all essentially unremarkable.  No other worrisome features. -Decrease soda intake -Filled out carehome paperwork  2) History of colon polyps: -Colonoscopy in 2019 with 2 subcentimeter tubular adenomas -Repeat colonoscopy in 2024 for ongoing polyp surveillance  3) GERD: -Well-controlled on current therapy.  No change to regimen at this time  RTC as needed  I spent 20 minutes of time, including independent review of  results as outlined above, communicating results with the patient directly, face-to-face time with the patient, coordinating care, ordering studies and medications as appropriate, and documentation.             [place] ,DO, FACG 07/08/2020, 10:28 AM

## 2020-07-08 NOTE — Patient Instructions (Signed)
If you are age 59 or older, your body mass index should be between 23-30. Your Body mass index is 30.29 kg/m. If this is out of the aforementioned range listed, please consider follow up with your Primary Care Provider.  If you are age 80 or younger, your body mass index should be between 19-25. Your Body mass index is 30.29 kg/m. If this is out of the aformentioned range listed, please consider follow up with your Primary Care Provider.   Follow up in one year.  It was a pleasure to see you today!  Vito Cirigliano, D.O.

## 2020-08-27 DIAGNOSIS — F209 Schizophrenia, unspecified: Secondary | ICD-10-CM | POA: Diagnosis not present

## 2020-08-27 DIAGNOSIS — F6381 Intermittent explosive disorder: Secondary | ICD-10-CM | POA: Diagnosis not present

## 2020-08-27 DIAGNOSIS — F72 Severe intellectual disabilities: Secondary | ICD-10-CM | POA: Diagnosis not present

## 2020-11-12 DIAGNOSIS — F72 Severe intellectual disabilities: Secondary | ICD-10-CM | POA: Diagnosis not present

## 2020-11-12 DIAGNOSIS — F209 Schizophrenia, unspecified: Secondary | ICD-10-CM | POA: Diagnosis not present

## 2020-11-12 DIAGNOSIS — F6381 Intermittent explosive disorder: Secondary | ICD-10-CM | POA: Diagnosis not present

## 2021-04-04 ENCOUNTER — Other Ambulatory Visit: Payer: Self-pay | Admitting: Gastroenterology

## 2021-04-21 DIAGNOSIS — H52222 Regular astigmatism, left eye: Secondary | ICD-10-CM | POA: Diagnosis not present

## 2021-04-21 DIAGNOSIS — E119 Type 2 diabetes mellitus without complications: Secondary | ICD-10-CM | POA: Diagnosis not present

## 2021-04-21 DIAGNOSIS — Z7984 Long term (current) use of oral hypoglycemic drugs: Secondary | ICD-10-CM | POA: Diagnosis not present

## 2021-04-21 DIAGNOSIS — H5203 Hypermetropia, bilateral: Secondary | ICD-10-CM | POA: Diagnosis not present

## 2021-04-21 DIAGNOSIS — H524 Presbyopia: Secondary | ICD-10-CM | POA: Diagnosis not present

## 2021-04-22 DIAGNOSIS — K219 Gastro-esophageal reflux disease without esophagitis: Secondary | ICD-10-CM | POA: Diagnosis not present

## 2021-04-22 DIAGNOSIS — E669 Obesity, unspecified: Secondary | ICD-10-CM | POA: Diagnosis not present

## 2021-04-22 DIAGNOSIS — E785 Hyperlipidemia, unspecified: Secondary | ICD-10-CM | POA: Diagnosis not present

## 2021-04-22 DIAGNOSIS — R739 Hyperglycemia, unspecified: Secondary | ICD-10-CM | POA: Diagnosis not present

## 2021-04-22 DIAGNOSIS — F209 Schizophrenia, unspecified: Secondary | ICD-10-CM | POA: Diagnosis not present

## 2021-04-22 DIAGNOSIS — Z0000 Encounter for general adult medical examination without abnormal findings: Secondary | ICD-10-CM | POA: Diagnosis not present

## 2021-05-05 DIAGNOSIS — F209 Schizophrenia, unspecified: Secondary | ICD-10-CM | POA: Diagnosis not present

## 2021-05-05 DIAGNOSIS — F72 Severe intellectual disabilities: Secondary | ICD-10-CM | POA: Diagnosis not present

## 2021-05-05 DIAGNOSIS — F6381 Intermittent explosive disorder: Secondary | ICD-10-CM | POA: Diagnosis not present

## 2021-06-06 DIAGNOSIS — R7309 Other abnormal glucose: Secondary | ICD-10-CM | POA: Diagnosis not present

## 2021-09-22 DIAGNOSIS — F72 Severe intellectual disabilities: Secondary | ICD-10-CM | POA: Diagnosis not present

## 2021-09-22 DIAGNOSIS — F209 Schizophrenia, unspecified: Secondary | ICD-10-CM | POA: Diagnosis not present

## 2021-09-22 DIAGNOSIS — F6381 Intermittent explosive disorder: Secondary | ICD-10-CM | POA: Diagnosis not present

## 2021-10-11 DIAGNOSIS — R7301 Impaired fasting glucose: Secondary | ICD-10-CM | POA: Diagnosis not present

## 2021-11-29 DIAGNOSIS — Z23 Encounter for immunization: Secondary | ICD-10-CM | POA: Diagnosis not present

## 2021-12-13 DIAGNOSIS — F72 Severe intellectual disabilities: Secondary | ICD-10-CM | POA: Diagnosis not present

## 2021-12-13 DIAGNOSIS — F209 Schizophrenia, unspecified: Secondary | ICD-10-CM | POA: Diagnosis not present

## 2021-12-13 DIAGNOSIS — F6381 Intermittent explosive disorder: Secondary | ICD-10-CM | POA: Diagnosis not present

## 2022-02-20 ENCOUNTER — Other Ambulatory Visit: Payer: Self-pay

## 2022-02-20 ENCOUNTER — Ambulatory Visit (INDEPENDENT_AMBULATORY_CARE_PROVIDER_SITE_OTHER): Payer: Medicare Other | Admitting: Gastroenterology

## 2022-02-20 ENCOUNTER — Encounter: Payer: Self-pay | Admitting: Gastroenterology

## 2022-02-20 VITALS — BP 140/88 | HR 64 | Ht 69.0 in | Wt 206.0 lb

## 2022-02-20 DIAGNOSIS — K580 Irritable bowel syndrome with diarrhea: Secondary | ICD-10-CM | POA: Diagnosis not present

## 2022-02-20 DIAGNOSIS — Z86010 Personal history of colonic polyps: Secondary | ICD-10-CM

## 2022-02-20 DIAGNOSIS — K219 Gastro-esophageal reflux disease without esophagitis: Secondary | ICD-10-CM

## 2022-02-20 NOTE — Patient Instructions (Addendum)
If you are age 61 or older, your body mass index should be between 23-30. Your Body mass index is 30.42 kg/m. If this is out of the aforementioned range listed, please consider follow up with your Primary Care Provider.  If you are age 24 or younger, your body mass index should be between 19-25. Your Body mass index is 30.42 kg/m. If this is out of the aformentioned range listed, please consider follow up with your Primary Care Provider.   ________________________________________________________  The [facility] providers would like to encourage you to use [facility] to communicate with providers for non-urgent requests or questions.  Due to long hold times on the telephone, sending your provider a message by [facility] may be a faster and more efficient way to get a response.  Please allow 48 business hours for a response.  Please remember that this is for non-urgent requests.  _______________________________________________________  Follow up in 2 years by calling us to make an appointment around 01-2024. If you have any questions or concerns please reach out to [place] at [phone]  It was a pleasure to see you today!  Vito Cirigliano, D.O.

## 2022-02-20 NOTE — Progress Notes (Signed)
Chief Complaint:    IBS-D  GI History: 61 year old male with a history of schizophrenia, GERD, follows in the GI clinic for IBS-D.   Extensive evaluation to date to include the following: -04/2017: AST/ALT 58/98 otherwise normal protein, albumin, bilirubin, alk phos normal CBC at that time. - 10/2017: Normalization of liver enzymes  with ALT of 25 and normal in 03/2018 -05/2017: Normal RUQ [place] -06/2018: TTG IgA <2, negative C. difficile, fecal leukocytes, ova parasite, fecal fat, normal CRP and ESR -Colonoscopy (09/2018, Dr. Bryan Lemma): 2 subcentimeter tubular adenomas, otherwise normal with biopsies negative for [facility].  Normal TI.  Repeat 5 years   Longstanding history of reflux, well controlled with Prilosec 20 mg/day  HPI:     Patient is a 61 y.o. male presenting to the Gastroenterology Clinic for follow-up.  Last seen by me on 07/08/2020.  He presents with his caretaker who provides his history.  Reports bowel habits are at baseline. Still with occasional post prandial urgency, but no incontinence, seepage. No longer drinking sodas which has made nice improvement in symptoms.  Otherwise, no specific complaints or active issues today.  Was recently seen by Dr. Arelia Sneddon, his PCM, and no medication changes per caretaker.  She forgot his medical log book today, but will call us back later to confirm accuracy of medication reconciliation.  No new labs or imaging for review since last appt.    Review of systems:     No chest pain, no SOB, no fevers, no urinary sx   Past Medical History:  Diagnosis Date   GERD (gastroesophageal reflux disease)    Hyperglycemia    Impaired fasting glucose    Irritable bowel syndrome with diarrhea    Moderate mental handicap    Obesity    Schizophrenia ([place])    Thyroid disease     Patient's surgical history, family medical history, social history, medications and allergies were all reviewed in Epic    Current Outpatient Medications  Medication Sig  Dispense Refill   atorvastatin (LIPITOR) 10 MG tablet Take 10 mg by mouth daily.     bismuth subsalicylate (PEPTO-BISMOL) 262 MG/15ML suspension Take 30 mLs by mouth as needed.     cholestyramine (QUESTRAN) 4 g packet Take 4 g by mouth as needed.      divalproex (DEPAKOTE) 250 MG DR tablet Take 250 mg by mouth 2 (two) times daily.     EPINEPHrine (EPIPEN IJ) Inject 0.3 mg as directed as needed.     omeprazole (PRILOSEC) 20 MG capsule TAKE ONE CAPSULE BY MOUTH DAILY (WHITE CAP WITH OME 20) 90 capsule 5   risperiDONE (RISPERDAL) 2 MG tablet Take 2 mg by mouth 2 (two) times daily.     vortioxetine HBr (TRINTELLIX) 5 MG TABS tablet Take 5 mg by mouth daily.     No current facility-administered medications for this visit.    Physical Exam:     There were no vitals taken for this visit.  GENERAL:  Pleasant male in NAD CARDIAC:  RRR, no murmur heard, no peripheral edema PULM: Normal respiratory effort, lungs CTA bilaterally, no wheezing ABDOMEN:  Nondistended, soft, nontender. No obvious masses, no hepatomegaly,  normal bowel sounds NEURO: Alert and oriented x 3, no focal neurologic deficits   IMPRESSION and PLAN:    1) IBS/fecal urgency: - Doing well.  Minimal active issues. Has had an extensive evaluation for these symptoms, all essentially unremarkable.  No other worrisome features. -Provided with physician letter today   2) History of  colon polyps: -Colonoscopy in 2019 with 2 subcentimeter tubular adenomas -Repeat colonoscopy in 09/2023 for ongoing polyp surveillance   3) GERD: -Well-controlled on current therapy.  No change to regimen at this time    RTC in 2 years or sooner as needed      Micheal Vazquez ,DO, FACG 02/20/2022, 1:49 PM

## 2024-01-02 ENCOUNTER — Encounter: Payer: Self-pay | Admitting: Gastroenterology

## 2024-01-22 ENCOUNTER — Ambulatory Visit (AMBULATORY_SURGERY_CENTER): Payer: Medicare Other | Admitting: *Deleted

## 2024-01-22 VITALS — Ht 69.0 in | Wt 220.0 lb

## 2024-01-22 DIAGNOSIS — Z860100 Personal history of colon polyps, unspecified: Secondary | ICD-10-CM

## 2024-01-22 MED ORDER — SUFLAVE 178.7 G PO SOLR: 1.0000 | Freq: Once | ORAL | 0 refills | Status: AC

## 2024-01-22 NOTE — Progress Notes (Signed)
Pt's name and DOB verified at the beginning of the pre-visit wit 2 identifiers  Pt denies any difficulty with ambulating,sitting, laying down or rolling side to side  Pt has no issues with ambulation   Pt has no issues moving head neck or swallowing  No egg or soy allergy known to patient   No issues known to pt with past sedation with any surgeries or procedures  Pt denies having issues being intubated  No FH of Malignant Hyperthermia  Pt is not on diet pills or shots  Pt is not on home 02   Pt is not on blood thinners   Pt denies issues with constipation   Pt is not on dialysis  Pt denise any abnormal heart rhythms   Pt denies any upcoming cardiac testing  Pt encouraged to use to use Singlecare or Goodrx to reduce cost   Patient's chart reviewed by Cathlyn Parsons CNRA prior to pre-visit and patient appropriate for the [facility].  Pre-visit completed and red dot placed by patient's name on their procedure day (on provider's schedule).  .  Visit by phone  Pt states weight is 220 lb  Instructed pt why it is important to and  to call if they have any changes in health or new medications. Directed them to the # given and on instructions.     Instructions reviewed. Pt given both [facility] main # and MD on call # prior to instructions.  Pt states understanding. Instructed to review again prior to procedure. Pt states they will.   Informed pt that they will receive a call from [facility] regarding there prep med.

## 2024-02-12 ENCOUNTER — Telehealth: Payer: Self-pay | Admitting: Gastroenterology

## 2024-02-12 NOTE — Telephone Encounter (Signed)
 Patient friend called and stated he is needing to reschedule his procedure due to the weather. Patient was rescheduled for March 19 th. Please advise.

## 2024-02-14 ENCOUNTER — Telehealth: Payer: Self-pay

## 2024-02-14 ENCOUNTER — Encounter: Payer: Medicare Other | Admitting: Gastroenterology

## 2024-02-14 NOTE — Telephone Encounter (Signed)
 Multiple attempts made to complete PV apt. Unable to leave a voicemail. Pt will need to call and r/s PV apt by end of day to avoid cancellation his upcoming colonoscopy. Pt does not have mychart. No show letter pending to be mailed to home address.

## 2024-03-12 ENCOUNTER — Encounter: Payer: Medicare Other | Admitting: Gastroenterology

## 2024-05-14 ENCOUNTER — Ambulatory Visit: Admitting: Gastroenterology

## 2024-06-09 ENCOUNTER — Ambulatory Visit: Admitting: Gastroenterology

## 2024-06-11 NOTE — Progress Notes (Deleted)
 06/11/2024 Micheal Vazquez [id number] 1961-09-15  Referring provider: Candiss Chamorro, * Primary GI doctor: {[id number]}  ASSESSMENT AND PLAN:    Patient Care Team: Candiss Chamorro, MD as PCP - General (Family Medicine)  HISTORY OF PRESENT ILLNESS: 63 y.o. male with a past medical history listed below presents for evaluation of ***.   *** Discussed the use of AI scribe software for clinical note transcription with the patient, who gave verbal consent to proceed.  History of Present Illness            He  reports that he has never smoked. He has never used smokeless tobacco. He reports that he does not drink alcohol and does not use drugs.  RELEVANT GI HISTORY, IMAGING AND LABS: Results          CBC    Component Value Date/Time   WBC 4.5 08/12/2014 1015   RBC 4.26 08/12/2014 1015   HGB 12.2 (L) 08/12/2014 1015   HCT 36.8 (L) 08/12/2014 1015   PLT 203 08/12/2014 1015   MCV 86.4 08/12/2014 1015   MCH 28.7 08/12/2014 1015   MCHC 33.2 08/12/2014 1015   RDW 13.7 08/12/2014 1015   LYMPHSABS 1.1 08/12/2014 1015   MONOABS 0.5 08/12/2014 1015   EOSABS 0.0 08/12/2014 1015   BASOSABS 0.0 08/12/2014 1015   No results for input(s): HGB in the last 8760 hours.  CMP     Component Value Date/Time   NA 139 08/12/2014 1018   K 3.9 08/12/2014 1018   CO2 26 08/12/2014 1018   GLUCOSE 93 08/12/2014 1018   BUN 10.7 08/12/2014 1018   CREATININE 0.9 08/12/2014 1018   CALCIUM 9.2 08/12/2014 1018   PROT 7.1 08/12/2014 1018   ALBUMIN 3.8 08/12/2014 1018   AST 18 08/12/2014 1018   ALT 11 08/12/2014 1018   ALKPHOS 77 08/12/2014 1018   BILITOT <0.20 08/12/2014 1018      Latest Ref Rng & Units 08/12/2014   10:18 AM 04/03/2014    1:58 PM  Hepatic Function  Total Protein 6.4 - 8.3 g/dL 7.1  7.9   Albumin 3.5 - 5.0 g/dL 3.8  3.0   AST 5 - 34 U/L 18  13   ALT 0 - 55 U/L 11  9   Alk Phosphatase 40 - 150 U/L 77  66   Total Bilirubin 0.20 - 1.20 mg/dL <4.09  <8.11        Current Medications:   Current Outpatient Medications (Endocrine & Metabolic):    metFORMIN (GLUCOPHAGE) 500 MG tablet, Take 500 mg by mouth daily. (Patient not taking: Reported on 01/22/2024)  Current Outpatient Medications (Cardiovascular):    atorvastatin (LIPITOR) 10 MG tablet, Take 10 mg by mouth daily.   cholestyramine (QUESTRAN) 4 g packet, Take 4 g by mouth as needed.  (Patient not taking: Reported on 01/22/2024)   EPINEPHrine (EPIPEN IJ), Inject 0.3 mg as directed as needed.     Current Outpatient Medications (Other):    bismuth subsalicylate (PEPTO-BISMOL) 262 MG/15ML suspension, Take 30 mLs by mouth as needed.   divalproex (DEPAKOTE) 250 MG DR tablet, Take 250 mg by mouth 2 (two) times daily. (Patient not taking: Reported on 01/22/2024)   famotidine (PEPCID) 20 MG tablet, Take 20 mg by mouth 2 (two) times daily.   omeprazole  (PRILOSEC) 20 MG capsule, TAKE ONE CAPSULE BY MOUTH DAILY (WHITE CAP WITH OME 20) (Patient not taking: Reported on 01/22/2024)   psyllium (HYDROCIL/METAMUCIL) 95 % PACK, Take 1 packet  by mouth daily.   risperiDONE (RISPERDAL) 2 MG tablet, Take 2 mg by mouth 2 (two) times daily.   vortioxetine HBr (TRINTELLIX) 5 MG TABS tablet, Take 5 mg by mouth daily.  Medical History:  Past Medical History:  Diagnosis Date   Anxiety    Depression    GERD (gastroesophageal reflux disease)    Hyperglycemia    Hyperlipidemia    Impaired fasting glucose    Irritable bowel syndrome with diarrhea    Moderate mental handicap    Obesity    Preproliferative diabetic retinopathy (HCC)    Schizophrenia (H[facility])    Thyroid  disease    Allergies:  Allergies  Allergen Reactions   Other     Bee Stings   Valium [Diazepam]     unknown     Surgical History:  He  has a past surgical history that includes Mouth surgery and Colonoscopy. Family History:  His family history is not on file.  REVIEW OF SYSTEMS  : All other systems reviewed and negative except where noted  in the History of Present Illness.  PHYSICAL EXAM: There were no vitals taken for this visit. Physical Exam          Edmonia Gottron, PA-C 8:19 AM

## 2024-06-12 ENCOUNTER — Ambulatory Visit: Admitting: Physician Assistant

## 2024-06-23 ENCOUNTER — Encounter: Payer: Self-pay | Admitting: Gastroenterology

## 2024-07-04 NOTE — Progress Notes (Unsigned)
 07/07/2024 Antonios Ostrow [id number] 25-Dec-1961  Referring provider: Loring Tanda Mae, * Primary GI doctor: Dr. San  ASSESSMENT AND PLAN:  Personal history of tubular adenomatous polyps Colonoscopy (09/2018, Dr. San): 2 subcentimeter tubular adenomas, otherwise normal with biopsies negative for [facility].  Normal TI.  Repeat 5 years  Will repeat colonoscopy We have discussed the risks of bleeding, infection, perforation, medication reactions, and remote risk of death associated with colonoscopy. All questions were answered and the patient acknowledges these risk and wishes to proceed.  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  IBS with diarrhea History of negative celiac 2019, negative fecal fat normal CRP sed rate Has BM daily, no blood in the stool No issues at this time No longer on medication  GERD 05/2017: Normal RUQ US   Continue prilosec 20 mg daily No GERD, nausea, no dysphagia  Patient Care Team: Loring Tanda Mae, MD as PCP - General (Family Medicine)  HISTORY OF PRESENT ILLNESS: 63 y.o. male with a past medical history listed below presents for evaluation of colonoscopy.   Last seen in the office 02/20/2022 for IBS with diarrhea by Dr. San.   Discussed the use of AI scribe software for clinical note transcription with the patient, who gave verbal consent to proceed.  History of Present Illness   The patient is a 63 year old who presents for a follow-up regarding previous colonoscopy findings and elevated liver labs.  He is here for  a follow-up after a colonoscopy performed in 2019, which revealed large polyps. He was advised to have a repeat colonoscopy in five years. He has regular bowel movements daily without issues such as loose stools or constipation. No rectal pain, heartburn, nausea, or difficulty swallowing food or pills.  He is currently taking Prilosec (omeprazole ) as needed. No symptoms of shortness of breath or chest pain. He is not currently taking colestyramine (Questran) for stool management.   Patient is accompanied by Phillis from the group home and she provides some of the history.    He  reports that he has never smoked. He has never used smokeless tobacco. He reports that he does not drink alcohol and does not use drugs.  RELEVANT GI HISTORY, IMAGING AND LABS: Results   RADIOLOGY Liver ultrasound: Findings consistent with elevated liver enzymes (2018)  DIAGNOSTIC Colonoscopy: Large colon polyps (2019)      CBC    Component Value Date/Time   WBC 4.5 08/12/2014 1015   RBC 4.26 08/12/2014 1015   HGB 12.2 (L) 08/12/2014 1015   HCT 36.8 (L) 08/12/2014 1015   PLT 203 08/12/2014 1015   MCV 86.4 08/12/2014 1015   MCH 28.7 08/12/2014 1015   MCHC 33.2 08/12/2014 1015   RDW 13.7 08/12/2014 1015   LYMPHSABS 1.1 08/12/2014 1015   MONOABS 0.5 08/12/2014 1015   EOSABS 0.0 08/12/2014 1015   BASOSABS 0.0 08/12/2014 1015   No results for input(s): HGB in the last 8760 hours.  CMP     Component Value Date/Time   NA 139 08/12/2014 1018   K 3.9 08/12/2014 1018   CO2 26 08/12/2014 1018  GLUCOSE 93 08/12/2014 1018   BUN 10.7 08/12/2014 1018   CREATININE 0.9 08/12/2014 1018   CALCIUM 9.2 08/12/2014 1018   PROT 7.1 08/12/2014 1018   ALBUMIN 3.8 08/12/2014 1018   AST 18 08/12/2014 1018   ALT 11 08/12/2014 1018   ALKPHOS 77 08/12/2014 1018   BILITOT <0.20 08/12/2014 1018      Latest Ref Rng & Units 08/12/2014   10:18 AM 04/03/2014    1:58 PM  Hepatic Function  Total Protein 6.4 - 8.3 g/dL 7.1   7.9   Albumin 3.5 - 5.0 g/dL 3.8  3.0   AST 5 - 34 U/L 18  13   ALT 0 - 55 U/L 11  9   Alk Phosphatase 40 - 150 U/L 77  66   Total Bilirubin 0.20 - 1.20 mg/dL <9.79  <9.79       Current Medications:    Current Outpatient Medications (Cardiovascular):    atorvastatin (LIPITOR) 10 MG tablet, Take 10 mg by mouth daily.   cholestyramine (QUESTRAN) 4 g packet, Take 4 g by mouth as needed.    EPINEPHrine (EPIPEN IJ), Inject 0.3 mg as directed as needed.     Current Outpatient Medications (Other):    bismuth subsalicylate (PEPTO-BISMOL) 262 MG/15ML suspension, Take 30 mLs by mouth as needed.   divalproex (DEPAKOTE) 250 MG DR tablet, Take 250 mg by mouth 2 (two) times daily.   famotidine (PEPCID) 20 MG tablet, Take 20 mg by mouth 2 (two) times daily.   Na Sulfate-K Sulfate-Mg Sulfate concentrate (SUPREP) 17.5-3.13-1.6 GM/177ML SOLN, Take 1 kit (354 mLs total) by mouth once for 1 dose.   omeprazole  (PRILOSEC) 20 MG capsule, TAKE ONE CAPSULE BY MOUTH DAILY (WHITE CAP WITH OME 20)   risperiDONE (RISPERDAL) 2 MG tablet, Take 2 mg by mouth 2 (two) times daily.   vortioxetine HBr (TRINTELLIX) 5 MG TABS tablet, Take 5 mg by mouth daily.  Medical History:  Past Medical History:  Diagnosis Date   Anxiety    Depression    GERD (gastroesophageal reflux disease)    Hyperglycemia    Hyperlipidemia    Impaired fasting glucose    Irritable bowel syndrome with diarrhea    Moderate mental handicap    Obesity    Preproliferative diabetic retinopathy (HCC)    Schizophrenia (H[facility])    Thyroid  disease    Allergies:  Allergies  Allergen Reactions   Other     Bee Stings   Valium [Diazepam]     unknown     Surgical History:  He  has a past surgical history that includes Mouth surgery and Colonoscopy. Family History:  His family history is not on file.  REVIEW OF SYSTEMS  : All other systems reviewed and negative except where noted in the History of Present Illness.  PHYSICAL EXAM: BP  130/78   Pulse 79   Ht 5' 9 (1.753 m)   Wt 199 lb (90.3 kg)   BMI 29.39 kg/m  Physical Exam   VITALS: P- 16, BP- 62/19 GENERAL APPEARANCE: Well nourished, in no apparent distress. HEENT: No cervical lymphadenopathy, unremarkable thyroid , sclerae anicteric, conjunctiva pink. RESPIRATORY: Respiratory effort normal, breath sounds equal bilaterally without rales, rhonchi, or wheezing. Lungs clear to auscultation bilaterally. CARDIO: Regular rate and rhythm with no murmurs, rubs, or gallops, peripheral pulses intact. ABDOMEN: Soft, non-distended, active bowel sounds in all four quadrants, non-tender to palpation, no rebound, no mass appreciated. RECTAL: Declines. MUSCULOSKELETAL: Full range of motion, normal gait, without edema. SKIN:  Dry, intact without rashes or lesions. No jaundice. NEURO: Alert, oriented to person, no focal deficits. PSYCH: Cooperative, normal mood and affect.      Alan JONELLE Coombs, PA-C 11:12 AM

## 2024-07-07 ENCOUNTER — Ambulatory Visit (INDEPENDENT_AMBULATORY_CARE_PROVIDER_SITE_OTHER): Admitting: Physician Assistant

## 2024-07-07 ENCOUNTER — Encounter: Payer: Self-pay | Admitting: Physician Assistant

## 2024-07-07 VITALS — BP 130/78 | HR 79 | Ht 69.0 in | Wt 199.0 lb

## 2024-07-07 DIAGNOSIS — K580 Irritable bowel syndrome with diarrhea: Secondary | ICD-10-CM

## 2024-07-07 DIAGNOSIS — R7989 Other specified abnormal findings of blood chemistry: Secondary | ICD-10-CM | POA: Diagnosis not present

## 2024-07-07 DIAGNOSIS — Z860101 Personal history of adenomatous and serrated colon polyps: Secondary | ICD-10-CM | POA: Diagnosis not present

## 2024-07-07 DIAGNOSIS — K219 Gastro-esophageal reflux disease without esophagitis: Secondary | ICD-10-CM | POA: Diagnosis not present

## 2024-07-07 DIAGNOSIS — Z860100 Personal history of colon polyps, unspecified: Secondary | ICD-10-CM

## 2024-07-07 MED ORDER — NA SULFATE-K SULFATE-MG SULF 17.5-3.13-1.6 GM/177ML PO SOLN: 1.0000 | Freq: Once | ORAL | 0 refills | Status: AC

## 2024-07-07 NOTE — Patient Instructions (Signed)
 You have been scheduled for a colonoscopy. Please follow written instructions given to you at your visit today.   If you use inhalers (even only as needed), please bring them with you on the day of your procedure.  DO NOT TAKE 7 DAYS PRIOR TO TEST- Trulicity (dulaglutide) Ozempic, Wegovy (semaglutide) Mounjaro (tirzepatide) Bydureon Bcise (exanatide extended release)  DO NOT TAKE 1 DAY PRIOR TO YOUR TEST Rybelsus (semaglutide) Adlyxin (lixisenatide) Victoza (liraglutide) Byetta (exanatide) ________________________________________________________________________  You have been scheduled for an abdominal ultrasound at [facility] (1st floor of hospital) on 07/11/24 at 10:45am. Please arrive 30 minutes prior to your appointment for registration. Make certain not to have anything to eat or drink 6 hours prior to your appointment. Should you need to reschedule your appointment, please contact radiology at [phone]. This test typically takes about 30 minutes to perform.  _______________________________________________________  If your blood pressure at your visit was 140/90 or greater, please contact your primary care physician to follow up on this.  _______________________________________________________  If you are age 69 or older, your body mass index should be between 23-30. Your Body mass index is 29.39 kg/m. If this is out of the aforementioned range listed, please consider follow up with your Primary Care Provider.  If you are age 72 or younger, your body mass index should be between 19-25. Your Body mass index is 29.39 kg/m. If this is out of the aformentioned range listed, please consider follow up with your Primary Care Provider.   ________________________________________________________  The [facility] providers would like to encourage you to use MYCHART to communicate with providers for non-urgent requests or questions.  Due to long hold times on the telephone,  sending your provider a message by [facility] may be a faster and more efficient way to get a response.  Please allow 48 business hours for a response.  Please remember that this is for non-urgent requests.  _______________________________________________________

## 2024-07-09 NOTE — Progress Notes (Signed)
 Agree with the assessment and plan as outlined by Quentin Mulling, PA-C. ? ?Keron Neenan, DO, FACG ? ?

## 2024-07-11 ENCOUNTER — Ambulatory Visit (HOSPITAL_COMMUNITY)
Admission: RE | Admit: 2024-07-11 | Discharge: 2024-07-11 | Disposition: A | Discharge status: Home / Self Care | Source: Ambulatory Visit | Attending: Physician Assistant | Admitting: Physician Assistant

## 2024-07-11 DIAGNOSIS — R7989 Other specified abnormal findings of blood chemistry: Secondary | ICD-10-CM | POA: Insufficient documentation

## 2024-07-14 ENCOUNTER — Ambulatory Visit: Payer: Self-pay | Admitting: Physician Assistant

## 2024-08-19 ENCOUNTER — Encounter: Payer: Self-pay | Admitting: Gastroenterology

## 2024-08-19 ENCOUNTER — Ambulatory Visit (AMBULATORY_SURGERY_CENTER): Admitting: Gastroenterology

## 2024-08-19 ENCOUNTER — Telehealth: Payer: Self-pay

## 2024-08-19 DIAGNOSIS — Z860100 Personal history of colon polyps, unspecified: Secondary | ICD-10-CM

## 2024-08-19 MED ORDER — SODIUM CHLORIDE 0.9 % IV SOLN: 500.0000 mL | Freq: Once | INTRAVENOUS | Status: DC

## 2024-08-19 NOTE — Telephone Encounter (Signed)
 Pt came in with care partner for colonoscopy today.  Pt's care partner was [place], owner of [facility] where pt resides.  Pt's chart shows that pt has a mental handicap.  Care partner confirmed that pt's mother is his legal guardian.  Pt had signed consent to have procedure during a previous office visit.  Explained to care partner that pt cannot sign his own consent if he has a legal guardian and that [facility] staff would need to get consent from legal guardian to perform procedure.  [facility] staff attempted to call Johnston Irons (mother/legal guardian) twice to get a verbal consent.  Legal guardian did not answer and staff was unable to leave a voice message since the voice mailbox was full.  Pt and guardian waited in lobby while [facility] staff tried to reach guardian.  Care partner told front desk staff that she and patient where leaving and would reschedule procedure since legal guardian could not be reached.

## 2024-08-19 NOTE — Progress Notes (Signed)
 GASTROENTEROLOGY PROCEDURE H&P NOTE   Primary Care Physician: Loring Tanda Mae, MD    Reason for Procedure:  Colon polyp surveillance  Plan:    Colonoscopy (canceled)  - Colonoscopy will need to be rescheduled as patient cannot sign his own consent and could not reach his legal guardian for telephonic consent - When scheduling previous appointment, can hopefully discuss with his mother (legal guardian) and provide verbal telephonic consent at that time to avoid this issue in the future - Care home staff will need to be committed to observing stools during his prep process to ensure adequacy   HPI: Micheal Vazquez is a 63 y.o. male who presents for colonoscopy for ongoing colon polyp surveillance and colon cancer screening.   Last colonoscopy was 09/2018 and notable for 2 subcentimeter adenomas, and colon biopsies otherwise negative for microscopic colitis.  Recommended repeat in 5 years.  Otherwise, no significant changes in clinical history since last office visit on 07/07/2024.  Per discussion with admitting staff, Breaker came in with care partner for colonoscopy today.  Pt's care partner was Micheal Vazquez, owner of [facility] where pt resides.  Pt's chart shows that pt has a mental handicap.  Care partner confirmed that pt's mother is his legal guardian.  Pt had signed consent to have procedure during a previous office visit.  Explained to care partner that pt cannot sign his own consent if he has a legal guardian and that [facility] staff would need to get consent from legal guardian to perform procedure.  [facility] staff attempted to call Micheal Vazquez (mother/legal guardian) twice to get a verbal consent.  Legal guardian did not answer and staff was unable to leave a voice message since the voice mailbox was full.  Pt and guardian waited in lobby while [facility] staff tried to reach guardian.  Care partner told front desk staff that she and patient where leaving and would reschedule  procedure since legal guardian could not be reached.     Past Medical History:  Diagnosis Date   Anxiety    Depression    GERD (gastroesophageal reflux disease)    Hyperglycemia    Hyperlipidemia    Impaired fasting glucose    Irritable bowel syndrome with diarrhea    Moderate mental handicap    Obesity    Preproliferative diabetic retinopathy (HCC)    Schizophrenia (HCC)    Thyroid  disease     Past Surgical History:  Procedure Laterality Date   COLONOSCOPY     MOUTH SURGERY     2018    Prior to Admission medications   Medication Sig Start Date End Date Taking? Authorizing Provider  atorvastatin (LIPITOR) 10 MG tablet Take 10 mg by mouth daily. 12/26/19   [provider]  bismuth subsalicylate (PEPTO-BISMOL) 262 MG/15ML suspension Take 30 mLs by mouth as needed.    [provider]  cholestyramine (QUESTRAN) 4 g packet Take 4 g by mouth as needed.     [provider]  divalproex (DEPAKOTE) 250 MG DR tablet Take 250 mg by mouth 2 (two) times daily.    [provider]  EPINEPHrine (EPIPEN IJ) Inject 0.3 mg as directed as needed.    [provider]  famotidine (PEPCID) 20 MG tablet Take 20 mg by mouth 2 (two) times daily. 01/07/24   [provider]  omeprazole  (PRILOSEC) 20 MG capsule TAKE ONE CAPSULE BY MOUTH DAILY (WHITE CAP WITH OME 20) 04/04/21   Marcayla Budge V, DO  risperiDONE (RISPERDAL) 2 MG tablet Take 2 mg by mouth 2 (two) times daily.    [provider]  vortioxetine HBr (TRINTELLIX) 5 MG TABS tablet Take 5 mg by mouth daily.    [provider]    Current Outpatient Medications  Medication Sig Dispense Refill   atorvastatin (LIPITOR) 10 MG tablet Take 10 mg by mouth daily.     bismuth subsalicylate (PEPTO-BISMOL) 262 MG/15ML suspension Take 30 mLs by mouth as needed.     cholestyramine (QUESTRAN) 4 g packet Take 4 g by mouth as needed.      divalproex (DEPAKOTE) 250 MG DR tablet Take 250 mg by  mouth 2 (two) times daily.     EPINEPHrine (EPIPEN IJ) Inject 0.3 mg as directed as needed.     famotidine (PEPCID) 20 MG tablet Take 20 mg by mouth 2 (two) times daily.     omeprazole  (PRILOSEC) 20 MG capsule TAKE ONE CAPSULE BY MOUTH DAILY (WHITE CAP WITH OME 20) 90 capsule 5   risperiDONE (RISPERDAL) 2 MG tablet Take 2 mg by mouth 2 (two) times daily.     vortioxetine HBr (TRINTELLIX) 5 MG TABS tablet Take 5 mg by mouth daily.     Current Facility-Administered Medications  Medication Dose Route Frequency Provider Last Rate Last Admin   0.9 %  sodium chloride  infusion  500 mL Intravenous Once Shields Pautz V, DO        Allergies as of 08/19/2024 - Review Complete 08/19/2024  Allergen Reaction Noted   Other  04/03/2014   Valium [diazepam]  04/03/2014    Family History  Problem Relation Age of Onset   Colon cancer Neg Hx    Stomach cancer Neg Hx    Rectal cancer Neg Hx    Colon polyps Neg Hx    Esophageal cancer Neg Hx     Social History   Socioeconomic History   Marital status: Single    Spouse name: Not on file   Number of children: 0   Years of education: Not on file   Highest education level: Not on file  Occupational History   Not on file  Tobacco Use   Smoking status: Never   Smokeless tobacco: Never  Vaping Use   Vaping status: Never Used  Substance and Sexual Activity   Alcohol use: Never   Drug use: Never   Sexual activity: Not on file  Other Topics Concern   Not on file  Social History Narrative   Not on file   Social Drivers of Health   Financial Resource Strain: Not on file  Food Insecurity: Not on file  Transportation Needs: Not on file  Physical Activity: Not on file  Stress: Not on file  Social Connections: Not on file  Intimate Partner Violence: Not on file    Physical Exam: Vital signs in last 24 hours: @There  were no vitals taken for this visit. GEN: NAD EYE: Sclerae anicteric ENT: MMM CV: Non-tachycardic Pulm: CTA b/l GI:  Soft, NT/ND NEURO:  Alert & Oriented x 3   Micheal Flatter, DO [facility]   08/19/2024 2:12 PM

## 2024-08-19 NOTE — Progress Notes (Signed)
 Started working on patient's chart, unable to continue as legal consent needs to be obtained prior to moving forward.

## 2024-08-19 NOTE — Progress Notes (Signed)
 Pt's states no medical or surgical changes since previsit or office visit.

## 2024-08-20 ENCOUNTER — Telehealth: Payer: Self-pay

## 2024-08-20 NOTE — Telephone Encounter (Signed)
 Pt showed for colonoscopy on 08/19/24 but procedure was canceled since [facility] did not have consent from patient's legal guardian.  Pt's mother/legal guardian called office today to give verbal consent for pt to have colonoscopy at [facility].  Verbal consent received by Isom Rail, RN and Izetta Shank, RN.  Pt's consent form will be filed in [place] office until visit is scheduled.  Staff will need to reach out to the patient's group home to reschedule colonoscopy appt.

## 2024-08-21 NOTE — Telephone Encounter (Signed)
 Called phone number on file [phone]) to reschedule procedure. The phone rang until it automated message stated enter remote access. Will attempt call again later.

## 2024-08-26 NOTE — Telephone Encounter (Signed)
 Called phone number on file to reschedule patient's procedure. The phone rang and then a automated message stated enter remote access.

## 2024-08-27 NOTE — Telephone Encounter (Signed)
 Called patient's mother (Irene-legal guardian) and informed her I have not been able to get in touch with patient's caregiver at [facility]. Johnston states she had to get a new number. I asked if she knew the number and Johnston informed me [place]. Johnston states she will reach out to the caregiver of the patient and have her contact our office when she is ready to reschedule her son's procedure.

## 2024-09-15 ENCOUNTER — Encounter: Payer: Self-pay | Admitting: Gastroenterology

## 2024-09-24 ENCOUNTER — Encounter

## 2024-09-24 ENCOUNTER — Telehealth: Payer: Self-pay

## 2024-09-24 NOTE — Telephone Encounter (Signed)
 Patient was a no-show for his appointment today.  Letter sent to  patient and legal guardian (mother) asking them to reschedule with legal guardian present so consents can be signed.

## 2024-10-15 ENCOUNTER — Encounter: Admitting: Gastroenterology

## 2024-10-23 ENCOUNTER — Ambulatory Visit (AMBULATORY_SURGERY_CENTER)

## 2024-10-23 VITALS — Ht 69.0 in | Wt 192.6 lb

## 2024-10-23 DIAGNOSIS — Z860100 Personal history of colon polyps, unspecified: Secondary | ICD-10-CM

## 2024-10-23 MED ORDER — NA SULFATE-K SULFATE-MG SULF 17.5-3.13-1.6 GM/177ML PO SOLN: 1.0000 | Freq: Once | ORAL | 0 refills | Status: AC

## 2024-10-23 NOTE — Progress Notes (Signed)
 No egg or soy allergy known to patient  No issues known to pt with past sedation with any surgeries or procedures Patient denies ever being told they had issues or difficulty with intubation  No FH of Malignant Hyperthermia Pt is not on diet pills Pt is not on  home 02  Pt is not on blood thinners  Pt denies issues with constipation  No A fib or A flutter Have any cardiac testing pending-- no  LOA: independent  Prep: suprep   PV completed with patient care provider is at visit to provide health history and answer questions  Prep instructions sent via mychart and hard copy provided to care giver at [facility] apt. Verbal consent obtained from legal guardian (mother) at time of visit. Second verified by Orie Chihuahua, RN

## 2024-11-06 ENCOUNTER — Ambulatory Visit: Admitting: Gastroenterology

## 2024-11-06 ENCOUNTER — Encounter: Payer: Self-pay | Admitting: Gastroenterology

## 2024-11-06 VITALS — BP 128/82 | HR 53 | Temp 97.2°F | Resp 11 | Ht 69.0 in | Wt 192.6 lb

## 2024-11-06 DIAGNOSIS — K641 Second degree hemorrhoids: Secondary | ICD-10-CM

## 2024-11-06 DIAGNOSIS — D125 Benign neoplasm of sigmoid colon: Secondary | ICD-10-CM

## 2024-11-06 DIAGNOSIS — Z860100 Personal history of colon polyps, unspecified: Secondary | ICD-10-CM

## 2024-11-06 DIAGNOSIS — K635 Polyp of colon: Secondary | ICD-10-CM | POA: Diagnosis not present

## 2024-11-06 DIAGNOSIS — D124 Benign neoplasm of descending colon: Secondary | ICD-10-CM

## 2024-11-06 DIAGNOSIS — K648 Other hemorrhoids: Secondary | ICD-10-CM

## 2024-11-06 DIAGNOSIS — Z860101 Personal history of adenomatous and serrated colon polyps: Secondary | ICD-10-CM

## 2024-11-06 DIAGNOSIS — D122 Benign neoplasm of ascending colon: Secondary | ICD-10-CM | POA: Diagnosis not present

## 2024-11-06 DIAGNOSIS — K5730 Diverticulosis of large intestine without perforation or abscess without bleeding: Secondary | ICD-10-CM

## 2024-11-06 DIAGNOSIS — Z1211 Encounter for screening for malignant neoplasm of colon: Secondary | ICD-10-CM | POA: Diagnosis not present

## 2024-11-06 MED ORDER — SODIUM CHLORIDE 0.9 % IV SOLN: 500.0000 mL | INTRAVENOUS | Status: DC

## 2024-11-06 NOTE — Op Note (Signed)
 [facility] Patient Name: Micheal Vazquez Procedure Date: 11/06/2024 11:18 AM MRN: [id number] Endoscopist: Sandor Flatter , MD, [id number] Age: 63 Referring MD:  Date of Birth: 1961/12/24 Gender: Male Account #: [id number] Procedure:                Colonoscopy Indications:              Surveillance: Personal history of adenomatous                            polyps on last colonoscopy > 5 years ago                           Last colonoscopy was 09/2018 and notable for 2                            subcentimeter adenomas, and colon biopsies                            otherwise negative for microscopic colitis.                            Recommended repeat in 5 years. Medicines:                Monitored Anesthesia Care Procedure:                Pre-Anesthesia Assessment:                           - Prior to the procedure, a History and Physical                            was performed, and patient medications and                            allergies were reviewed. The patient's tolerance of                            previous anesthesia was also reviewed. The risks                            and benefits of the procedure and the sedation                            options and risks were discussed with the patient                            and his healthcare power of attorney. All questions                            were answered, and informed consent was obtained.                            Prior Anticoagulants: The patient has taken no  anticoagulant or antiplatelet agents. ASA Grade                            Assessment: II - A patient with mild systemic                            disease. After reviewing the risks and benefits,                            the patient was deemed in satisfactory condition to                            undergo the procedure.                           After obtaining informed consent, the colonoscope                             was passed under direct vision. Throughout the                            procedure, the patient's blood pressure, pulse, and                            oxygen saturations were monitored continuously. The                            Olympus Scope SN: [id number] was introduced through                            the anus and advanced to the the cecum, identified                            by appendiceal orifice and ileocecal valve. The                            colonoscopy was performed without difficulty. The                            patient tolerated the procedure well. The quality                            of the bowel preparation was fair. The ileocecal                            valve, appendiceal orifice, and rectum were                            photographed. Scope In: 11:23:54 AM Scope Out: 11:43:27 AM Scope Withdrawal Time: 0 hours 16 minutes 37 seconds  Total Procedure Duration: 0 hours 19 minutes 33 seconds  Findings:                 The perianal and digital rectal examinations were  normal.                           Three sessile polyps were found in the descending                            colon and ascending colon. The polyps were 3 to 5                            mm in size. These polyps were removed with a cold                            snare. Resection and retrieval were complete.                            Estimated blood loss was minimal.                           Two sessile polyps were found in the sigmoid colon.                            The polyps were 3 to 6 mm in size. These polyps                            were removed with a cold snare. Resection and                            retrieval were complete. Estimated blood loss was                            minimal.                           A few small-mouthed diverticula were found in the                            sigmoid colon and transverse colon.                           A  moderate amount of stool was found scattered                            throughout the entire colon, interfering with                            visualization. Lavage of the area was performed                            using copious amounts of sterile water, resulting                            in clearance with fair visualization.  Non-bleeding internal hemorrhoids were found during                            retroflexion. The hemorrhoids were small. Complications:            No immediate complications. Estimated Blood Loss:     Estimated blood loss was minimal. Impression:               - Preparation of the colon was fair.                           - Three 3 to 5 mm polyps in the descending colon                            and in the ascending colon, removed with a cold                            snare. Resected and retrieved.                           - Two 3 to 6 mm polyps in the sigmoid colon,                            removed with a cold snare. Resected and retrieved.                           - Diverticulosis in the sigmoid colon and in the                            transverse colon.                           - Stool in the entire examined colon.                           - Non-bleeding internal hemorrhoids. Recommendation:           - Patient has a contact number available for                            emergencies. The signs and symptoms of potential                            delayed complications were discussed with the                            patient. Return to normal activities tomorrow.                            Written discharge instructions were provided to the                            patient.                           - Resume previous  diet.                           - Continue present medications.                           - Await pathology results.                           - Repeat colonoscopy in 2 years because the bowel                             preparation was suboptimal and for surveillance.                           - Plan for extended 2-day prep at time of repeat                            colonoscopy.                           - Return to GI clinic PRN. Sandor Flatter, MD 11/06/2024 11:53:14 AM

## 2024-11-06 NOTE — Progress Notes (Signed)
 To pacu, VSS. Report to [email or web address]

## 2024-11-06 NOTE — Progress Notes (Signed)
 GASTROENTEROLOGY PROCEDURE H&P NOTE   Primary Care Physician: Loring Tanda Mae, MD    Reason for Procedure:  Colon polyp surveillance  Plan:    Colonoscopy  Patient is appropriate for endoscopic procedure(s) in the ambulatory ([facility]) setting.  The nature of the procedure, as well as the risks, benefits, and alternatives were carefully and thoroughly reviewed with the patient and legal guardian. Ample time for discussion and questions allowed. The patient and HCPOA understood, was satisfied, and agreed to proceed. I personally addressed all patient questions and concerns.     HPI: Micheal Vazquez is a 63 y.o. male who presents for colonoscopy for ongoing colon polyp surveillance and colon cancer screening.  No active GI symptoms.  No known family history of colon cancer or related malignancy.  Patient is otherwise without complaints or active issues today.  Last colonoscopy was 09/2018 and notable for 2 subcentimeter adenomas, and colon biopsies otherwise negative for microscopic colitis.  Recommended repeat in 5 years.   Otherwise, no significant changes in clinical history since last office visit on 07/07/2024.  Past Medical History:  Diagnosis Date   Anxiety    Depression    GERD (gastroesophageal reflux disease)    Hyperglycemia    Hyperlipidemia    Impaired fasting glucose    Irritable bowel syndrome with diarrhea    Moderate mental handicap    Obesity    Preproliferative diabetic retinopathy (HCC)    Schizophrenia (HCC)    Thyroid  disease     Past Surgical History:  Procedure Laterality Date   COLONOSCOPY     MOUTH SURGERY     2018    Prior to Admission medications   Medication Sig Start Date End Date Taking? Authorizing Provider  atorvastatin (LIPITOR) 10 MG tablet Take 10 mg by mouth daily. 12/26/19  Yes [provider]  divalproex (DEPAKOTE) 250 MG DR tablet Take 250 mg by mouth 2 (two) times daily.   Yes [provider]  famotidine (PEPCID)  20 MG tablet Take 20 mg by mouth 2 (two) times daily. 01/07/24  Yes [provider]  risperiDONE (RISPERDAL) 2 MG tablet Take 2 mg by mouth 2 (two) times daily.   Yes [provider]  vortioxetine HBr (TRINTELLIX) 5 MG TABS tablet Take 5 mg by mouth daily.   Yes [provider]  bismuth subsalicylate (PEPTO-BISMOL) 262 MG/15ML suspension Take 30 mLs by mouth as needed. Patient not taking: No sig reported    [provider]  cholestyramine (QUESTRAN) 4 g packet Take 4 g by mouth as needed.  Patient not taking: No sig reported    [provider]  EPINEPHrine (EPIPEN IJ) Inject 0.3 mg as directed as needed.    [provider]  omeprazole  (PRILOSEC) 20 MG capsule TAKE ONE CAPSULE BY MOUTH DAILY (WHITE CAP WITH OME 20) Patient not taking: Reported on 11/06/2024 04/04/21   Quartez Lagos V, DO    Current Outpatient Medications  Medication Sig Dispense Refill   atorvastatin (LIPITOR) 10 MG tablet Take 10 mg by mouth daily.     divalproex (DEPAKOTE) 250 MG DR tablet Take 250 mg by mouth 2 (two) times daily.     famotidine (PEPCID) 20 MG tablet Take 20 mg by mouth 2 (two) times daily.     risperiDONE (RISPERDAL) 2 MG tablet Take 2 mg by mouth 2 (two) times daily.     vortioxetine HBr (TRINTELLIX) 5 MG TABS tablet Take 5 mg by mouth daily.     bismuth  subsalicylate (PEPTO-BISMOL) 262 MG/15ML suspension Take 30 mLs by mouth as needed. (Patient not taking: No sig reported)     cholestyramine (QUESTRAN) 4 g packet Take 4 g by mouth as needed.  (Patient not taking: No sig reported)     EPINEPHrine (EPIPEN IJ) Inject 0.3 mg as directed as needed.     omeprazole  (PRILOSEC) 20 MG capsule TAKE ONE CAPSULE BY MOUTH DAILY (WHITE CAP WITH OME 20) (Patient not taking: Reported on 11/06/2024) 90 capsule 5   Current Facility-Administered Medications  Medication Dose Route Frequency Provider Last Rate Last Admin   0.9 %  sodium chloride  infusion  500 mL  Intravenous Once Annison Birchard V, DO       0.9 %  sodium chloride  infusion  500 mL Intravenous Continuous Havana Baldwin V, DO        Allergies as of 11/06/2024 - Review Complete 11/06/2024  Allergen Reaction Noted   Other  04/03/2014   Valium [diazepam]  04/03/2014    Family History  Problem Relation Age of Onset   Colon cancer Neg Hx    Stomach cancer Neg Hx    Rectal cancer Neg Hx    Colon polyps Neg Hx    Esophageal cancer Neg Hx     Social History   Socioeconomic History   Marital status: Single    Spouse name: Not on file   Number of children: 0   Years of education: Not on file   Highest education level: Not on file  Occupational History   Not on file  Tobacco Use   Smoking status: Never   Smokeless tobacco: Never  Vaping Use   Vaping status: Never Used  Substance and Sexual Activity   Alcohol use: Never   Drug use: Never   Sexual activity: Not on file  Other Topics Concern   Not on file  Social History Narrative   Not on file   Social Drivers of Health   Financial Resource Strain: Not on file  Food Insecurity: Not on file  Transportation Needs: Not on file  Physical Activity: Not on file  Stress: Not on file  Social Connections: Not on file  Intimate Partner Violence: Not on file    Physical Exam: Vital signs in last 24 hours: @BP  127/74   Pulse 70   Temp (!) 97.2 F (36.2 C) (Temporal)   Ht 5' 9 (1.753 m)   Wt 192 lb 9.6 oz (87.4 kg)   SpO2 98%   BMI 28.44 kg/m  GEN: NAD EYE: Sclerae anicteric ENT: MMM CV: Non-tachycardic Pulm: CTA b/l GI: Soft, NT/ND NEURO:  Alert & Oriented x 3   Sandor Flatter, DO [facility]   11/06/2024 11:16 AM

## 2024-11-06 NOTE — Patient Instructions (Signed)

## 2024-11-06 NOTE — Progress Notes (Signed)
 Pt's states no medical or surgical changes since previsit or office visit.

## 2024-11-06 NOTE — Progress Notes (Signed)
 Called to room to assist during endoscopic procedure.  Patient ID and intended procedure confirmed with present staff. Received instructions for my participation in the procedure from the performing physician.

## 2024-11-07 ENCOUNTER — Telehealth: Payer: Self-pay

## 2024-11-07 NOTE — Telephone Encounter (Signed)
 LMOM

## 2024-11-11 LAB — SURGICAL PATHOLOGY

## 2024-11-14 ENCOUNTER — Ambulatory Visit: Payer: Self-pay | Admitting: Gastroenterology

## 2025-01-15 ENCOUNTER — Other Ambulatory Visit: Payer: Self-pay

## 2025-01-15 ENCOUNTER — Telehealth: Payer: Self-pay

## 2025-01-15 DIAGNOSIS — K760 Fatty (change of) liver, not elsewhere classified: Secondary | ICD-10-CM

## 2025-01-15 DIAGNOSIS — R7989 Other specified abnormal findings of blood chemistry: Secondary | ICD-10-CM

## 2025-01-15 NOTE — Telephone Encounter (Signed)
-----   Message from Nurse Cristino NOVAK, RN sent at 07/15/2024 10:54 AM EDT ----- Regarding: 26-month labs CBC and LFTs due - LFTs and CBC - Micheal Vazquez - fatty liver/elevated LFTs

## 2025-01-15 NOTE — Telephone Encounter (Signed)
 Unable to reach legal guardian, VM not an option.

## 2025-01-16 NOTE — Telephone Encounter (Signed)
 Unable to reach legal guardian, VM not an option.

## 2025-01-22 NOTE — Telephone Encounter (Signed)
 Third attempt to reach LG. Unable to leave VM. Letter mailed home.
